# Patient Record
Sex: Female | Born: 1965 | Race: Black or African American | Hispanic: No | Marital: Single | Smoking: Never smoker
Health system: Southern US, Community
[De-identification: ages and names within clinical notes are randomized; demographics above are authoritative.]

## PROBLEM LIST (undated history)

## (undated) DIAGNOSIS — T7840XA Allergy, unspecified, initial encounter: Secondary | ICD-10-CM

## (undated) DIAGNOSIS — R7301 Impaired fasting glucose: Secondary | ICD-10-CM

## (undated) DIAGNOSIS — M1990 Unspecified osteoarthritis, unspecified site: Secondary | ICD-10-CM

## (undated) DIAGNOSIS — N6089 Other benign mammary dysplasias of unspecified breast: Principal | ICD-10-CM

## (undated) HISTORY — DX: Allergy, unspecified, initial encounter: T78.40XA

## (undated) HISTORY — DX: Unspecified osteoarthritis, unspecified site: M19.90

## (undated) HISTORY — DX: Impaired fasting glucose: R73.01

## (undated) HISTORY — DX: Other benign mammary dysplasias of unspecified breast: N60.89

## (undated) HISTORY — PX: KNEE SURGERY: SHX244

---

## 1995-12-09 HISTORY — PX: TUBAL LIGATION: SHX77

## 2000-06-01 ENCOUNTER — Other Ambulatory Visit: Admission: RE | Admit: 2000-06-01 | Discharge: 2000-06-01 | Payer: Self-pay | Admitting: Obstetrics and Gynecology

## 2000-06-11 ENCOUNTER — Encounter: Admission: RE | Admit: 2000-06-11 | Discharge: 2000-06-11 | Payer: Self-pay | Admitting: Obstetrics and Gynecology

## 2000-06-11 ENCOUNTER — Encounter: Payer: Self-pay | Admitting: Obstetrics and Gynecology

## 2000-06-22 ENCOUNTER — Ambulatory Visit (HOSPITAL_COMMUNITY): Admission: RE | Admit: 2000-06-22 | Discharge: 2000-06-22 | Payer: Self-pay | Admitting: Obstetrics and Gynecology

## 2000-06-22 ENCOUNTER — Encounter: Payer: Self-pay | Admitting: Obstetrics and Gynecology

## 2001-07-23 ENCOUNTER — Other Ambulatory Visit: Admission: RE | Admit: 2001-07-23 | Discharge: 2001-07-23 | Payer: Self-pay | Admitting: Obstetrics and Gynecology

## 2001-07-28 ENCOUNTER — Encounter: Payer: Self-pay | Admitting: Obstetrics and Gynecology

## 2001-07-28 ENCOUNTER — Ambulatory Visit (HOSPITAL_COMMUNITY): Admission: RE | Admit: 2001-07-28 | Discharge: 2001-07-28 | Payer: Self-pay | Admitting: Obstetrics and Gynecology

## 2003-01-19 ENCOUNTER — Ambulatory Visit (HOSPITAL_COMMUNITY): Admission: RE | Admit: 2003-01-19 | Discharge: 2003-01-19 | Payer: Self-pay | Admitting: Family Medicine

## 2003-01-19 ENCOUNTER — Encounter: Payer: Self-pay | Admitting: Family Medicine

## 2003-04-17 ENCOUNTER — Other Ambulatory Visit: Admission: RE | Admit: 2003-04-17 | Discharge: 2003-04-17 | Payer: Self-pay | Admitting: Obstetrics and Gynecology

## 2004-04-05 ENCOUNTER — Encounter: Admission: RE | Admit: 2004-04-05 | Discharge: 2004-04-05 | Payer: Self-pay | Admitting: General Surgery

## 2004-06-07 ENCOUNTER — Other Ambulatory Visit: Admission: RE | Admit: 2004-06-07 | Discharge: 2004-06-07 | Payer: Self-pay | Admitting: Obstetrics and Gynecology

## 2005-06-25 ENCOUNTER — Encounter: Admission: RE | Admit: 2005-06-25 | Discharge: 2005-06-25 | Payer: Self-pay | Admitting: Family Medicine

## 2005-07-01 ENCOUNTER — Other Ambulatory Visit: Admission: RE | Admit: 2005-07-01 | Discharge: 2005-07-01 | Payer: Self-pay | Admitting: Obstetrics and Gynecology

## 2006-06-18 ENCOUNTER — Other Ambulatory Visit: Admission: RE | Admit: 2006-06-18 | Discharge: 2006-06-18 | Payer: Self-pay | Admitting: Obstetrics and Gynecology

## 2006-06-29 ENCOUNTER — Encounter: Admission: RE | Admit: 2006-06-29 | Discharge: 2006-06-29 | Payer: Self-pay | Admitting: Obstetrics and Gynecology

## 2007-07-05 ENCOUNTER — Encounter: Admission: RE | Admit: 2007-07-05 | Discharge: 2007-07-05 | Payer: Self-pay | Admitting: Obstetrics and Gynecology

## 2008-07-05 ENCOUNTER — Encounter: Admission: RE | Admit: 2008-07-05 | Discharge: 2008-07-05 | Payer: Self-pay | Admitting: Family Medicine

## 2008-07-27 ENCOUNTER — Encounter: Admission: RE | Admit: 2008-07-27 | Discharge: 2008-07-27 | Payer: Self-pay | Admitting: Family Medicine

## 2008-08-11 ENCOUNTER — Encounter (INDEPENDENT_AMBULATORY_CARE_PROVIDER_SITE_OTHER): Payer: Self-pay | Admitting: Obstetrics and Gynecology

## 2008-08-11 ENCOUNTER — Ambulatory Visit (HOSPITAL_COMMUNITY): Admission: RE | Admit: 2008-08-11 | Discharge: 2008-08-11 | Payer: Self-pay | Admitting: Obstetrics and Gynecology

## 2009-05-15 ENCOUNTER — Encounter
Admission: RE | Admit: 2009-05-15 | Discharge: 2009-05-15 | Payer: Self-pay | Admitting: Physical Medicine and Rehabilitation

## 2009-08-31 ENCOUNTER — Encounter: Admission: RE | Admit: 2009-08-31 | Discharge: 2009-08-31 | Payer: Self-pay | Admitting: Obstetrics and Gynecology

## 2010-09-27 ENCOUNTER — Encounter: Admission: RE | Admit: 2010-09-27 | Discharge: 2010-09-27 | Payer: Self-pay | Admitting: Obstetrics and Gynecology

## 2010-12-08 DIAGNOSIS — N6089 Other benign mammary dysplasias of unspecified breast: Secondary | ICD-10-CM

## 2010-12-08 HISTORY — DX: Other benign mammary dysplasias of unspecified breast: N60.89

## 2010-12-29 ENCOUNTER — Encounter: Payer: Self-pay | Admitting: General Surgery

## 2010-12-30 ENCOUNTER — Encounter: Payer: Self-pay | Admitting: Family Medicine

## 2011-04-22 NOTE — Op Note (Signed)
NAMEMARIANY, MACKINTOSH                 ACCOUNT NO.:  [id number]   MEDICAL RECORD NO.:  [id number]          PATIENT TYPE:  AMB   LOCATION:  SDC                           FACILITY:  [facility]   PHYSICIAN:  Osborn Coho, M.D.   DATE OF BIRTH:  05-27-1966   DATE OF PROCEDURE:  08/11/2008  DATE OF DISCHARGE:                               OPERATIVE REPORT   PREOPERATIVE DIAGNOSES:  1. Menorrhagia.  2. Endometrial mass.   POSTOPERATIVE DIAGNOSIS:  Menorrhagia.   PROCEDURES:  1. Hysteroscopy.  2. Dilation and curettage.  3. Ablation via NovaSure.   ATTENDING:  Osborn Coho, MD   ANESTHESIA:  General via LMA.   SPECIMENS TO PATHOLOGY:  Endometrial curettings.   FINDINGS:  No obvious intracavitary lesion noted.   FLUIDS:  1300 mL.   URINE OUTPUT:  Quantity sufficient via straight cath prior to procedure.   ESTIMATED BLOOD LOSS:  Minimal.   HYSTEROSCOPIC FLUID:  Deficit of LR, 90 mL.   COMPLICATIONS:  None.   DESCRIPTION OF PROCEDURE:  The patient was taken to the operating room  after risks, benefits, and alternatives discussed with the patient.  The  patient verbalized understanding and consent signed and witnessed.  The  patient was placed under general per anesthesia and prepped and draped  in the normal sterile fashion.  A bivalve speculum was placed in the  patient's vagina, and the anterior lip of the cervix grasped with a  single-tooth tenaculum.  A paracervical block was administered using a  total of 10 mL of 1% lidocaine.  The cervix was dilated for passage of  the hysteroscope.  The uterus sounded to 10 cm, and the cervical length  measured 5 cm.  The diagnostic hysteroscope was introduced and no  obvious intracavitary lesions were noted.  Sharp curettage was performed  until a gritty texture was noted and curettings sent to pathology.  The  cervix was then dilated for passage of the NovaSure instrument.  The  NovaSure instrument was introduced into the intrauterine  cavity with a  cavity length set at 5 cm and cavity width measuring 4.5 cm.  The  ablation was performed for 86 seconds at a power of 124 watts.  The  procedure was performed without difficulty, and after removing the  NovaSure instrument, diagnostic hysteroscope  was introduced once again and good ablation results were noted.  All  instruments were removed, and there was good hemostasis at the tenaculum  site.  Count was correct.  The patient tolerated the procedure well and  is currently awaiting transfer to the recovery room in good condition.      Osborn Coho, M.D.  Electronically Signed     AR/MEDQ  D:  08/11/2008  T:  08/12/2008  Job:  [id number]

## 2011-08-18 ENCOUNTER — Other Ambulatory Visit: Payer: Self-pay | Admitting: Obstetrics and Gynecology

## 2011-08-18 DIAGNOSIS — Z1231 Encounter for screening mammogram for malignant neoplasm of breast: Secondary | ICD-10-CM

## 2011-09-10 LAB — CBC
Hemoglobin: 12
MCV: 89.4
Platelets: 221
RBC: 4.02
RDW: 14
WBC: 3.5 — ABNORMAL LOW

## 2011-10-02 ENCOUNTER — Other Ambulatory Visit: Payer: Self-pay | Admitting: Obstetrics and Gynecology

## 2011-10-02 ENCOUNTER — Ambulatory Visit
Admission: RE | Admit: 2011-10-02 | Discharge: 2011-10-02 | Disposition: A | Discharge status: Home / Self Care | Payer: 59 | Source: Ambulatory Visit | Attending: Obstetrics and Gynecology | Admitting: Obstetrics and Gynecology

## 2011-10-02 DIAGNOSIS — Z1231 Encounter for screening mammogram for malignant neoplasm of breast: Secondary | ICD-10-CM

## 2011-10-06 ENCOUNTER — Ambulatory Visit (INDEPENDENT_AMBULATORY_CARE_PROVIDER_SITE_OTHER): Payer: Self-pay | Admitting: Surgery

## 2011-10-15 ENCOUNTER — Other Ambulatory Visit: Payer: 59

## 2011-10-20 ENCOUNTER — Ambulatory Visit (INDEPENDENT_AMBULATORY_CARE_PROVIDER_SITE_OTHER): Payer: 59 | Admitting: Surgery

## 2011-10-31 ENCOUNTER — Other Ambulatory Visit: Payer: Self-pay | Admitting: Obstetrics and Gynecology

## 2011-10-31 ENCOUNTER — Ambulatory Visit
Admission: RE | Admit: 2011-10-31 | Discharge: 2011-10-31 | Disposition: A | Discharge status: Home / Self Care | Payer: 59 | Source: Ambulatory Visit | Attending: Obstetrics and Gynecology | Admitting: Obstetrics and Gynecology

## 2011-10-31 DIAGNOSIS — N6325 Unspecified lump in the left breast, overlapping quadrants: Secondary | ICD-10-CM

## 2011-11-05 ENCOUNTER — Ambulatory Visit (INDEPENDENT_AMBULATORY_CARE_PROVIDER_SITE_OTHER): Payer: 59 | Admitting: General Surgery

## 2011-11-05 ENCOUNTER — Encounter (INDEPENDENT_AMBULATORY_CARE_PROVIDER_SITE_OTHER): Payer: Self-pay | Admitting: General Surgery

## 2011-11-05 VITALS — BP 132/84 | HR 70 | Temp 97.6°F | Resp 16 | Ht 65.5 in | Wt 203.8 lb

## 2011-11-05 DIAGNOSIS — N6089 Other benign mammary dysplasias of unspecified breast: Secondary | ICD-10-CM

## 2011-11-05 NOTE — Progress Notes (Signed)
Patient ID: Sharon Bray, female   DOB: 26-Sep-1966, 46 y.o.   MRN: [id number]  Chief Complaint  Patient presents with  . Other    Evaluate left breast cyst    HPI Sharon Bray is a 45 y.o. female. This patient is referred by Dr. Normand Sloop for evaluation of a left breast lesion. She states that this has been present for a few months and has been evaluated for this already by our practice, but elected not to have anything done at that time for fear that it might return. She states that this fluctuates in size and will occasionally get a "rash" in the area. She denies any drainage from the site. She denies any fevers or chills. She denies any other breast masses. She does state that she does her breast exam and has not noticed any other suspicious lesion. She denies any family history of breast cancer. HPI  Past Medical History  Diagnosis Date  . Arthritis   . Sebaceous cyst of breast 2012    left breast    Past Surgical History  Procedure Date  . Knee surgery 1997, 2004  . Tubal ligation 1997    No family history on file.  Social History History  Substance Use Topics  . Smoking status: Never Smoker   . Smokeless tobacco: Not on file  . Alcohol Use: No    Allergies no known allergies  No current outpatient prescriptions on file.    Review of Systems Review of Systems All other review of systems negative or noncontributory except as stated in the HPI  Blood pressure 132/84, pulse 70, temperature 97.6 F (36.4 C), temperature source Temporal, resp. rate 16, height 5' 5.5" (1.664 m), weight 203 lb 12.8 oz (92.443 kg).  Physical Exam Physical Exam  Vitals reviewed. Constitutional: She is oriented to person, place, and time. She appears well-developed and well-nourished. No distress.  HENT:  Head: Normocephalic and atraumatic.  Mouth/Throat: No oropharyngeal exudate.  Eyes: Conjunctivae are normal. Pupils are equal, round, and reactive to light. No scleral icterus.  Neck:  Normal range of motion. No tracheal deviation present.  Cardiovascular: Normal rate, regular rhythm and normal heart sounds.  Exam reveals no gallop.   Pulmonary/Chest: Effort normal and breath sounds normal. No stridor. No respiratory distress. She has no wheezes.       She has no suspicious gross masses bilaterally or axillary lymphadenopathy. On the inner left breast at the inframammary crease she has a 2 cm x 2 cm area of induration with central punctum consistent with epidermal inclusion cyst. There is no evidence of erythema or infection.  Abdominal: Soft. Bowel sounds are normal. She exhibits no distension. There is no tenderness.  Musculoskeletal: Normal range of motion. She exhibits no edema.  Neurological: She is alert and oriented to person, place, and time. She has normal reflexes.  Skin: Skin is warm and dry. No rash noted. She is not diaphoretic. No erythema. No pallor.  Psychiatric: She has a normal mood and affect. Her behavior is normal. Judgment and thought content normal.    Data Reviewed Mammo  Assessment    Left breast sebaceous cyst  This patient to be a sebaceous cyst without any active inflammation or infection. I've discussed with her the options for continued observation or surgical excision and she would like to proceed with excision. We discussed the risks of procedure including poor cosmesis and recurrence and she expressed understanding and desires to proceed with excision of left breast cyst.  Plan    We'll plan for surgical excision as soon as available       Lodema Pilot DAVID 11/05/2011, 8:51 AM

## 2012-08-20 ENCOUNTER — Other Ambulatory Visit: Payer: Self-pay | Admitting: Obstetrics and Gynecology

## 2012-08-20 DIAGNOSIS — Z1231 Encounter for screening mammogram for malignant neoplasm of breast: Secondary | ICD-10-CM

## 2012-10-11 ENCOUNTER — Ambulatory Visit
Admission: RE | Admit: 2012-10-11 | Discharge: 2012-10-11 | Disposition: A | Discharge status: Home / Self Care | Payer: 59 | Source: Ambulatory Visit | Attending: Obstetrics and Gynecology | Admitting: Obstetrics and Gynecology

## 2012-10-11 ENCOUNTER — Encounter: Payer: Self-pay | Admitting: Obstetrics and Gynecology

## 2012-10-11 ENCOUNTER — Ambulatory Visit (INDEPENDENT_AMBULATORY_CARE_PROVIDER_SITE_OTHER): Payer: 59 | Admitting: Obstetrics and Gynecology

## 2012-10-11 VITALS — BP 122/80 | Ht 65.0 in | Wt 221.0 lb

## 2012-10-11 DIAGNOSIS — Z1231 Encounter for screening mammogram for malignant neoplasm of breast: Secondary | ICD-10-CM

## 2012-10-11 DIAGNOSIS — R21 Rash and other nonspecific skin eruption: Secondary | ICD-10-CM

## 2012-10-11 DIAGNOSIS — Z124 Encounter for screening for malignant neoplasm of cervix: Secondary | ICD-10-CM

## 2012-10-11 MED ORDER — CLOTRIMAZOLE 1 % EX CREA: TOPICAL_CREAM | Freq: Two times a day (BID) | CUTANEOUS | Status: DC

## 2012-10-11 NOTE — Progress Notes (Signed)
Last Pap: 09/27/11 WNL: Yes Regular Periods:no Contraception: na/  Monthly Breast exam:yes Tetanus<61yrs:yes Nl.Bladder Function:yes Daily BMs:yes Healthy Diet:yes Calcium:no Mammogram:yes Date of Mammogram: 2013 wnl per pt Exercise:yes Have often Exercise: 3 times a week  Seatbelt: yes Abuse at home: no Stressful work:no Sigmoid-colonoscopy: n/a Bone Density: No PCP: Dr. Threasa Beards Change in PMH: no change  Change in FMH:no change BP 122/80  Ht 5\' 5"  (1.651 m)  Wt 221 lb (100.245 kg)  BMI 36.78 kg/m2 Pt with complaints:yes pt with rash under her breast.   Physical Examination: General appearance - alert, well appearing, and in no distress Mental status - normal mood, behavior, speech, dress, motor activity, and thought processes Neck - supple, no significant adenopathy,  thyroid exam: thyroid is normal in size without nodules or tenderness Chest - clear to auscultation, no wheezes, rales or rhonchi, symmetric air entry Heart - normal rate and regular rhythm Abdomen - soft, nontender, nondistended, no masses or organomegaly Breasts - breasts appear normal, no suspicious masses, no skin or nipple changes or axillary nodes Pelvic - normal external genitalia, vulva, vagina, cervix, uterus and adnexa Rectal - rectal exam not indicated Back exam - full range of motion, no tenderness, palpable spasm or pain on motion Neurological - alert, oriented, normal speech, no focal findings or movement disorder noted Musculoskeletal - no joint tenderness, deformity or swelling Extremities - no edema, redness or tenderness in the calves or thighs Skin - normal coloration and turgor, no rashes, no suspicious skin lesions noted Routine exam Pap sent no due 2015 Mammogram due done today.  Pt with known breast cyst BTL used for contraception RT 1 yr Pt happy with ablation .  Often does not have a menses

## 2012-10-11 NOTE — Patient Instructions (Signed)

## 2012-10-12 ENCOUNTER — Encounter: Payer: Self-pay | Admitting: Obstetrics and Gynecology

## 2012-11-25 ENCOUNTER — Encounter (INDEPENDENT_AMBULATORY_CARE_PROVIDER_SITE_OTHER): Payer: Self-pay | Admitting: General Surgery

## 2012-11-25 ENCOUNTER — Ambulatory Visit (INDEPENDENT_AMBULATORY_CARE_PROVIDER_SITE_OTHER): Payer: 59 | Admitting: General Surgery

## 2012-11-25 VITALS — BP 134/76 | HR 77 | Temp 98.8°F | Resp 18 | Ht 65.5 in | Wt 222.8 lb

## 2012-11-25 DIAGNOSIS — L720 Epidermal cyst: Secondary | ICD-10-CM

## 2012-11-25 DIAGNOSIS — L723 Sebaceous cyst: Secondary | ICD-10-CM

## 2012-11-25 NOTE — Progress Notes (Signed)
Patient ID: Sharon Bray, female   DOB: 19-Jan-1966, 46 y.o.   MRN: [id number]  Chief Complaint  Patient presents with  . Establish Care    cyst under left breast    HPI Sharon Bray is a 46 y.o. female. This patient is known to me for prior evaluation of the left breast skin cyst. She was seen by me last year and offered excision of this.  She was actually seen previously in our office as well as for treatment of this skin cyst was infected. She completed antibiotics at that time and since then has only had some intermittent episodes of inflammation where the lesion swells and then spontaneously decreases in size. She recently had a mammogram which was okay by patient report. HPI  Past Medical History  Diagnosis Date  . Arthritis   . Sebaceous cyst of breast 2012    left breast    Past Surgical History  Procedure Date  . Knee surgery 1997, 2004  . Tubal ligation 1997    No family history on file.  Social History History  Substance Use Topics  . Smoking status: Never Smoker   . Smokeless tobacco: Not on file  . Alcohol Use: No    No Known Allergies  Current Outpatient Prescriptions  Medication Sig Dispense Refill  . clotrimazole (LOTRIMIN AF) 1 % cream Apply topically 2 (two) times daily.  30 g  0  . doxycycline (PERIOSTAT) 20 MG tablet Take 20 mg by mouth 2 (two) times daily.      Marland Kitchen acyclovir (ZOVIRAX) 400 MG tablet         Review of Systems Review of Systems All other review of systems negative or noncontributory except as stated in the HPI  Blood pressure 134/76, pulse 77, temperature 98.8 F (37.1 C), resp. rate 18, height 5' 5.5" (1.664 m), weight 222 lb 12.8 oz (101.061 kg).  Physical Exam Physical Exam Physical Exam  Nursing note and vitals reviewed. Constitutional: She is oriented to person, place, and time. She appears well-developed and well-nourished. No distress.  HENT:  Head: Normocephalic and atraumatic.  Mouth/Throat: No oropharyngeal exudate.   Eyes: Conjunctivae and EOM are normal. Pupils are equal, round, and reactive to light. Right eye exhibits no discharge. Left eye exhibits no discharge. No scleral icterus.  Neck: Normal range of motion. Neck supple. No tracheal deviation present.  Cardiovascular: Normal rate, regular rhythm, normal heart sounds and intact distal pulses.   Pulmonary/Chest: Effort normal and breath sounds normal. No stridor. No respiratory distress. She has no wheezes.  Abdominal: Soft. Bowel sounds are normal. She exhibits no distension and no mass. There is no tenderness. There is no rebound and no guarding.  Musculoskeletal: Normal range of motion. She exhibits no edema and no tenderness.  Neurological: She is alert and oriented to person, place, and time.  Skin: Skin is warm and dry. No rash noted. She is not diaphoretic. No erythema. No pallor.  Psychiatric: She has a normal mood and affect. Her behavior is normal. Judgment and thought content normal.  Breast: left breast has a 2 cm EDIC at the inner and inferior portion of the breast, no sign of infection  Data Reviewed   Assessment    Left breast epidermal inclusion cyst-no sign of infection I believe that this is a benign epidermal inclusion cyst but it sounds like it has bothered her with infection and subacute infections with swelling and spontaneous resolution. She would like have this removed. I offered excision.  We discussed the risks of infection, bleeding, pain, scarring, recurrence, and persistent pain. She says that she does have some pain when lying on her left side and I explained that this was unlikely to to the skin cyst and that would likely persist even after excision.    Plan    Will plan for excision of left breast skin cyst       Mazal Ebey DAVID 11/25/2012, 11:06 AM

## 2013-03-28 ENCOUNTER — Ambulatory Visit (INDEPENDENT_AMBULATORY_CARE_PROVIDER_SITE_OTHER): Payer: 59 | Admitting: Nurse Practitioner

## 2013-03-28 ENCOUNTER — Encounter: Payer: Self-pay | Admitting: Nurse Practitioner

## 2013-03-28 VITALS — BP 122/82 | Temp 98.5°F | Wt 232.2 lb

## 2013-03-28 DIAGNOSIS — R7309 Other abnormal glucose: Secondary | ICD-10-CM

## 2013-03-28 DIAGNOSIS — R609 Edema, unspecified: Secondary | ICD-10-CM

## 2013-03-28 DIAGNOSIS — R7302 Impaired glucose tolerance (oral): Secondary | ICD-10-CM | POA: Insufficient documentation

## 2013-03-28 DIAGNOSIS — R5383 Other fatigue: Secondary | ICD-10-CM

## 2013-03-28 DIAGNOSIS — E781 Pure hyperglyceridemia: Secondary | ICD-10-CM | POA: Insufficient documentation

## 2013-03-28 MED ORDER — PHENTERMINE HCL 37.5 MG PO CAPS: 37.5000 mg | ORAL_CAPSULE | ORAL | Status: DC

## 2013-03-28 NOTE — Assessment & Plan Note (Signed)
Routine lab work. Discussed importance of weight loss with healthy diet and activity. Phentermine 37.5 mg 1 by mouth every morning. Reviewed potential adverse effects. DC med and call if any problems. Otherwise recheck in one month.

## 2013-03-28 NOTE — Progress Notes (Signed)
Subjective:  Presents for complaints of mild swelling in the ankles especially in the evenings for the past 3-4 months. Occurs about every day. Now has a job sitting at a desk at work. Occurs whether she is walking or sitting. No chest pain/ischemic type pain unusual shortness of breath or orthopnea. We have not seen patient in our office since May 2010. According to patient, she has gained over 30 pounds in the past 6 months. Has decreased her exercise. Eats fairly well at work but not doing well with her diet at home. Drinks a lot of water. No sodas or sweet tea. Recently had a health fair at work, a fingerstick cholesterol indicated elevated triglycerides and a fasting sugar of 110. Gets regular preventive health physicals and mammograms. No labs at her gynecologist. Does not add any salt to her diet.  Objective:   BP 122/82  Temp(Src) 98.5 F (36.9 C) (Oral)  Wt 232 lb 4 oz (105.348 kg)  BMI 38.05 kg/m2 NAD. Alert, oriented. Lungs clear. Heart regular rate rhythm. No murmur or gallop noted. Carotids no bruits or thrills. Abdomen soft nondistended without obvious masses or tenderness, exam limited due to abdominal girth. Waist circumference greater than 35 with central obesity. Lower extremities trace pitting edema.  Assessment:Impaired glucose tolerance - Plan: Basic metabolic panel, Hemoglobin A1c, Lipid panel, Basic metabolic panel, Hemoglobin A1c, Lipid panel  Fatigue - Plan: Basic metabolic panel, Hepatic function panel, TSH, Vitamin D 25 hydroxy, Basic metabolic panel, Hepatic function panel, TSH, Vitamin D 25 hydroxy  Morbid obesity - Plan: Basic metabolic panel, Hepatic function panel, Lipid panel, Basic metabolic panel, Hepatic function panel, Lipid panel  Hypertriglyceridemia - Plan: Lipid panel, Lipid panel  Peripheral edema  Plan: Routine lab work. Discussed importance of weight loss with healthy diet and activity. Phentermine 37.5 mg 1 by mouth every morning. Reviewed potential  adverse effects. DC med and call if any problems. Otherwise recheck in one month.

## 2013-03-29 LAB — HEMOGLOBIN A1C: Mean Plasma Glucose: 120 mg/dL — ABNORMAL HIGH (ref <117)

## 2013-03-29 LAB — HEPATIC FUNCTION PANEL
Albumin: 3.6 g/dL (ref 3.5–5.2)
Bilirubin, Direct: 0.1 mg/dL (ref 0.0–0.3)
Total Bilirubin: 0.4 mg/dL (ref 0.3–1.2)

## 2013-03-29 LAB — LIPID PANEL
Cholesterol: 115 mg/dL (ref 0–200)
HDL: 35 mg/dL — ABNORMAL LOW (ref >39)
Total CHOL/HDL Ratio: 3.3 Ratio
Triglycerides: 105 mg/dL (ref <150)

## 2013-03-29 LAB — BASIC METABOLIC PANEL
Calcium: 8.9 mg/dL (ref 8.4–10.5)
Creat: 0.74 mg/dL (ref 0.50–1.10)
Sodium: 141 mEq/L (ref 135–145)

## 2013-03-29 LAB — TSH: TSH: 1.516 u[IU]/mL (ref 0.350–4.500)

## 2013-03-30 LAB — VITAMIN D 25 HYDROXY (VIT D DEFICIENCY, FRACTURES): Vit D, 25-Hydroxy: <10 ng/mL — ABNORMAL LOW (ref 30–89)

## 2013-04-01 ENCOUNTER — Other Ambulatory Visit: Payer: Self-pay

## 2013-04-01 MED ORDER — METFORMIN HCL 500 MG PO TABS: 500.0000 mg | ORAL_TABLET | Freq: Two times a day (BID) | ORAL | Status: DC

## 2013-04-01 MED ORDER — ERGOCALCIFEROL 1.25 MG (50000 UT) PO CAPS: 50000.0000 [IU] | ORAL_CAPSULE | ORAL | Status: DC

## 2013-04-28 ENCOUNTER — Ambulatory Visit (INDEPENDENT_AMBULATORY_CARE_PROVIDER_SITE_OTHER): Payer: 59 | Admitting: Nurse Practitioner

## 2013-04-28 ENCOUNTER — Encounter: Payer: Self-pay | Admitting: Nurse Practitioner

## 2013-04-28 VITALS — BP 113/76 | HR 90 | Wt 225.6 lb

## 2013-04-28 DIAGNOSIS — R7301 Impaired fasting glucose: Secondary | ICD-10-CM

## 2013-04-28 DIAGNOSIS — E559 Vitamin D deficiency, unspecified: Secondary | ICD-10-CM

## 2013-04-28 MED ORDER — PHENTERMINE HCL 37.5 MG PO CAPS: 37.5000 mg | ORAL_CAPSULE | ORAL | Status: DC

## 2013-04-28 NOTE — Patient Instructions (Addendum)
Vitamin D 1000 units per day (in multivitamin)   Alive vitamin

## 2013-04-29 ENCOUNTER — Encounter: Payer: Self-pay | Admitting: Nurse Practitioner

## 2013-04-29 NOTE — Assessment & Plan Note (Signed)
Continue phentermine as directed. Start walking program as planned. Recheck in 3 months.

## 2013-04-29 NOTE — Progress Notes (Signed)
Subjective:  Presents for recheck. Taking her prescription vitamin D. Stopped eating late at night. Does not drink any regular soda or sweet tea. Tries to avoid simple carbs and sugar. Drinking water. Has a desk job. Plans to begin a walking program. Slight swelling in the ankles at times especially when she has been sitting all day. No chest pain shortness of breath orthopnea.  Objective:   BP 113/76  Pulse 90  Wt 225 lb 9.6 oz (102.331 kg)  BMI 36.96 kg/m2 NAD. Alert, oriented. Lungs clear. Heart regular rate rhythm. Lower extremities trace pitting edema.  Assessment:Impaired fasting glucose  Unspecified vitamin D deficiency  Morbid obesity  Plan: Continue phentermine as directed. Complete vitamin D as directed then OTC supplement. Recheck in 3 months, call back sooner if any problems.

## 2013-05-03 ENCOUNTER — Other Ambulatory Visit (HOSPITAL_COMMUNITY)
Admission: RE | Admit: 2013-05-03 | Discharge: 2013-05-03 | Disposition: A | Discharge status: Home / Self Care | Payer: 59 | Source: Ambulatory Visit | Attending: General Surgery | Admitting: General Surgery

## 2013-05-03 ENCOUNTER — Other Ambulatory Visit: Payer: Self-pay | Admitting: General Surgery

## 2013-05-03 ENCOUNTER — Other Ambulatory Visit (HOSPITAL_COMMUNITY): Admission: RE | Admit: 2013-05-03 | Payer: 59 | Source: Ambulatory Visit | Admitting: General Surgery

## 2013-05-03 DIAGNOSIS — N6009 Solitary cyst of unspecified breast: Secondary | ICD-10-CM | POA: Insufficient documentation

## 2013-05-03 DIAGNOSIS — N6019 Diffuse cystic mastopathy of unspecified breast: Secondary | ICD-10-CM | POA: Insufficient documentation

## 2013-07-16 ENCOUNTER — Other Ambulatory Visit: Payer: Self-pay | Admitting: Nurse Practitioner

## 2013-07-29 ENCOUNTER — Ambulatory Visit: Payer: 59 | Admitting: Family Medicine

## 2013-07-30 ENCOUNTER — Encounter: Payer: Self-pay | Admitting: *Deleted

## 2013-08-01 ENCOUNTER — Encounter: Payer: Self-pay | Admitting: Nurse Practitioner

## 2013-08-01 ENCOUNTER — Ambulatory Visit (INDEPENDENT_AMBULATORY_CARE_PROVIDER_SITE_OTHER): Payer: 59 | Admitting: Nurse Practitioner

## 2013-08-01 VITALS — BP 132/88 | Ht 65.5 in | Wt 222.0 lb

## 2013-08-01 DIAGNOSIS — R7301 Impaired fasting glucose: Secondary | ICD-10-CM

## 2013-08-01 MED ORDER — PHENTERMINE HCL 37.5 MG PO CAPS: 37.5000 mg | ORAL_CAPSULE | ORAL | Status: DC

## 2013-08-05 DIAGNOSIS — R7301 Impaired fasting glucose: Secondary | ICD-10-CM | POA: Insufficient documentation

## 2013-08-05 NOTE — Assessment & Plan Note (Signed)
Phentermine for 3 more months then discontinue.

## 2013-08-05 NOTE — Progress Notes (Signed)
Subjective:  Presents for routine followup. Doing better with her diet. Has had some increased stress due to her work schedule. 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 No chest pain shortness of breath or edema. Minimal exercise.  Objective:   BP 132/88  Ht 5' 5.5" (1.664 m)  Wt 222 lb (100.699 kg)  BMI 36.37 kg/m2 NAD. Alert, oriented. Lungs clear. Heart regular rate rhythm. No murmur or gallop noted. FBS 91.  Assessment:Impaired fasting glucose - Plan: POCT glucose (manual entry)  Morbid obesity  Plan: Meds ordered this encounter  Medications  . OVER THE COUNTER MEDICATION    Sig: Vitamin D one daily  . DISCONTD: phentermine 37.5 MG capsule    Sig: Take 1 capsule (37.5 mg total) by mouth every morning.    Dispense:  30 capsule    Refill:  2    Order Specific Question:  Supervising Provider    Answer:  Merlyn Albert [[id number]]  . phentermine 37.5 MG capsule    Sig: Take 1 capsule (37.5 mg total) by mouth every morning.    Dispense:  30 capsule    Refill:  2    Order Specific Question:  Supervising Provider    Answer:  Merlyn Albert [[id number]]   Patient given 3 months of phentermine then discontinue. Encouraged healthy diet and regular activity. Recommend physical in October.

## 2013-08-30 ENCOUNTER — Ambulatory Visit: Payer: 59 | Admitting: Family Medicine

## 2013-09-26 ENCOUNTER — Other Ambulatory Visit: Payer: Self-pay

## 2013-09-26 DIAGNOSIS — Z1231 Encounter for screening mammogram for malignant neoplasm of breast: Secondary | ICD-10-CM

## 2013-10-13 ENCOUNTER — Other Ambulatory Visit: Payer: Self-pay

## 2013-10-20 ENCOUNTER — Telehealth: Payer: Self-pay | Admitting: *Deleted

## 2013-10-20 NOTE — Telephone Encounter (Signed)
Pt states area at 06/30/2013 toenail removal procedure is darkened.  I left a message informing pt, the area could be darkened up to 6 - 9 months, but should gradually dissipate, as long there is no redness, drainage or pain, the post-op was progressing as expected.

## 2013-10-21 ENCOUNTER — Ambulatory Visit
Admission: RE | Admit: 2013-10-21 | Discharge: 2013-10-21 | Disposition: A | Discharge status: Home / Self Care | Payer: 59 | Source: Ambulatory Visit

## 2013-10-21 DIAGNOSIS — Z1231 Encounter for screening mammogram for malignant neoplasm of breast: Secondary | ICD-10-CM

## 2014-01-02 ENCOUNTER — Ambulatory Visit: Payer: 59 | Admitting: Nurse Practitioner

## 2014-01-16 ENCOUNTER — Encounter: Payer: Self-pay | Admitting: Nurse Practitioner

## 2014-01-16 ENCOUNTER — Ambulatory Visit (INDEPENDENT_AMBULATORY_CARE_PROVIDER_SITE_OTHER): Payer: 59 | Admitting: Nurse Practitioner

## 2014-01-16 VITALS — BP 146/88 | Ht 65.5 in | Wt 227.0 lb

## 2014-01-16 DIAGNOSIS — R7309 Other abnormal glucose: Secondary | ICD-10-CM

## 2014-01-16 DIAGNOSIS — R7301 Impaired fasting glucose: Secondary | ICD-10-CM

## 2014-01-16 DIAGNOSIS — R739 Hyperglycemia, unspecified: Secondary | ICD-10-CM

## 2014-01-16 DIAGNOSIS — R7302 Impaired glucose tolerance (oral): Secondary | ICD-10-CM

## 2014-01-16 LAB — GLUCOSE, POCT (MANUAL RESULT ENTRY): POC Glucose: 102 mg/dl — AB (ref 70–99)

## 2014-01-16 LAB — POCT GLYCOSYLATED HEMOGLOBIN (HGB A1C): Hemoglobin A1C: 5.8

## 2014-01-16 MED ORDER — METFORMIN HCL 500 MG PO TABS: 500.0000 mg | ORAL_TABLET | Freq: Two times a day (BID) | ORAL | Status: DC

## 2014-01-20 ENCOUNTER — Encounter: Payer: Self-pay | Admitting: Nurse Practitioner

## 2014-01-20 NOTE — Progress Notes (Signed)
Subjective:  Presents for routine followup of her sugar. Has not done very well with her diet. Limited exercise. No chest pain shortness of breath or edema. Gets regular preventive health physicals with her gynecologist, has had her Pap smear and mammogram. Is due for her colonoscopy later this year. Would like information on this.  Objective:   BP 146/88  Ht 5' 5.5" (1.664 m)  Wt 227 lb (102.967 kg)  BMI 37.19 kg/m2 NAD. Alert, oriented. Lungs clear. Heart regular rate rhythm. Lower extremities no edema. Results for orders placed in visit on 01/16/14  POCT GLYCOSYLATED HEMOGLOBIN (HGB A1C)      Result Value Ref Range   Hemoglobin A1C 5.8    GLUCOSE, POCT (MANUAL RESULT ENTRY)      Result Value Ref Range   POC Glucose 102 (*) 70 - 99 mg/dl   Assessment: Problem List Items Addressed This Visit     Endocrine   Impaired glucose tolerance   Impaired fasting glucose - Primary     Other   Morbid obesity   Relevant Medications      metFORMIN (GLUCOPHAGE) tablet    Other Visit Diagnoses   Hyperglycemia        Relevant Orders       POCT glycosylated hemoglobin (Hb A1C) (Completed)       POCT glucose (manual entry) (Completed)      Plan: Meds ordered this encounter  Medications  . metFORMIN (GLUCOPHAGE) 500 MG tablet    Sig: Take 1 tablet (500 mg total) by mouth 2 (two) times daily with a meal.    Dispense:  60 tablet    Refill:  5    Order Specific Question:  Supervising Provider    Answer:  Merlyn AlbertLUKING, WILLIAM S [[id number]]   Discussed importance of regular activity healthy diet and weight loss. Set a goal of 22 pounds or 10% of her body weight has our goal. Given written information on colonoscopy, patient to schedule this. Return in about 4 months (around 05/16/2014). Patient to call before visit so routine yearly lab work can be ordered.

## 2014-03-21 ENCOUNTER — Telehealth: Payer: Self-pay | Admitting: Family Medicine

## 2014-03-21 NOTE — Telephone Encounter (Signed)
[facility] [place]

## 2014-03-21 NOTE — Telephone Encounter (Signed)
Patient was seen back in February and wasn't given a prescription for test strips to check her sugar.so she need that .    She is going to get the [facility] machine will she need a prescription for that Call into [facility] [place] phone number [phone]

## 2014-03-21 NOTE — Telephone Encounter (Signed)
Please call in Rx for glucose testing strips; not sure how many times a day she is testing. Please give a year refill.

## 2014-03-28 ENCOUNTER — Telehealth: Payer: Self-pay | Admitting: Family Medicine

## 2014-03-28 NOTE — Telephone Encounter (Signed)
Patient said that [facility] [place] said they never received the Rx for glucose testing strips and she needs this re faxed in.

## 2014-03-28 NOTE — Telephone Encounter (Signed)
rx refaxed. Pt notified on voicemail.

## 2014-06-06 ENCOUNTER — Encounter: Payer: Self-pay | Admitting: Nurse Practitioner

## 2014-06-06 ENCOUNTER — Ambulatory Visit (INDEPENDENT_AMBULATORY_CARE_PROVIDER_SITE_OTHER): Payer: 59 | Admitting: Nurse Practitioner

## 2014-06-06 VITALS — BP 124/88 | Ht 65.5 in | Wt 229.0 lb

## 2014-06-06 DIAGNOSIS — R5383 Other fatigue: Secondary | ICD-10-CM

## 2014-06-06 DIAGNOSIS — E781 Pure hyperglyceridemia: Secondary | ICD-10-CM

## 2014-06-06 DIAGNOSIS — R7302 Impaired glucose tolerance (oral): Secondary | ICD-10-CM

## 2014-06-06 DIAGNOSIS — R5381 Other malaise: Secondary | ICD-10-CM

## 2014-06-06 DIAGNOSIS — R7309 Other abnormal glucose: Secondary | ICD-10-CM

## 2014-06-06 DIAGNOSIS — R7301 Impaired fasting glucose: Secondary | ICD-10-CM

## 2014-06-06 MED ORDER — ACYCLOVIR 400 MG PO TABS: ORAL_TABLET | ORAL | Status: DC

## 2014-06-06 MED ORDER — METFORMIN HCL 500 MG PO TABS: 500.0000 mg | ORAL_TABLET | Freq: Two times a day (BID) | ORAL | Status: DC

## 2014-06-07 ENCOUNTER — Other Ambulatory Visit: Payer: Self-pay | Admitting: Nurse Practitioner

## 2014-06-07 LAB — BASIC METABOLIC PANEL
BUN: 11 mg/dL (ref 6–23)
CO2: 25 meq/L (ref 19–32)
Calcium: 8.7 mg/dL (ref 8.4–10.5)
Chloride: 108 mEq/L (ref 96–112)
Creat: 0.76 mg/dL (ref 0.50–1.10)
GLUCOSE: 86 mg/dL (ref 70–99)
POTASSIUM: 3.8 meq/L (ref 3.5–5.3)
SODIUM: 140 meq/L (ref 135–145)

## 2014-06-07 LAB — HEPATIC FUNCTION PANEL
ALK PHOS: 59 U/L (ref 39–117)
ALT: 11 U/L (ref 0–35)
AST: 15 U/L (ref 0–37)
Albumin: 3.9 g/dL (ref 3.5–5.2)
BILIRUBIN INDIRECT: 0.4 mg/dL (ref 0.2–1.2)
BILIRUBIN TOTAL: 0.5 mg/dL (ref 0.2–1.2)
Bilirubin, Direct: 0.1 mg/dL (ref 0.0–0.3)
Total Protein: 6.9 g/dL (ref 6.0–8.3)

## 2014-06-07 LAB — LIPID PANEL
CHOL/HDL RATIO: 3.9 ratio
Cholesterol: 125 mg/dL (ref 0–200)
HDL: 32 mg/dL — AB (ref >39)
LDL CALC: 66 mg/dL (ref 0–99)
Triglycerides: 134 mg/dL (ref <150)
VLDL: 27 mg/dL (ref 0–40)

## 2014-06-07 LAB — TSH: TSH: 1.233 u[IU]/mL (ref 0.350–4.500)

## 2014-06-07 LAB — VITAMIN D 25 HYDROXY (VIT D DEFICIENCY, FRACTURES): VIT D 25 HYDROXY: 14 ng/mL — AB (ref 30–89)

## 2014-06-07 MED ORDER — VITAMIN D (ERGOCALCIFEROL) 1.25 MG (50000 UNIT) PO CAPS: 50000.0000 [IU] | ORAL_CAPSULE | ORAL | Status: DC

## 2014-06-08 ENCOUNTER — Encounter: Payer: Self-pay | Admitting: Nurse Practitioner

## 2014-06-08 NOTE — Progress Notes (Signed)
Subjective:  Presents for recheck. Taking her metformin twice a day. Blood sugars at home have run less than 100 fasting. Gets regular preventive health physicals with her gynecologist. No chest pain shortness of breath or edema. Continues to work on weight loss.  Objective:   BP 124/88  Ht 5' 5.5" (1.664 m)  Wt 229 lb (103.874 kg)  BMI 37.51 kg/m2 NAD. Alert, oriented. Lungs clear. Heart regular rate rhythm. Lower extremities no edema.  Assessment:  Problem List Items Addressed This Visit     Endocrine   Impaired glucose tolerance   Relevant Orders      Basic metabolic panel (Completed)   Impaired fasting glucose - Primary   Relevant Orders      Basic metabolic panel (Completed)     Other   Morbid obesity   Relevant Medications      metFORMIN (GLUCOPHAGE) tablet   Other Relevant Orders      Hepatic function panel (Completed)   Hypertriglyceridemia   Relevant Orders      Hepatic function panel (Completed)      Lipid panel (Completed)    Other Visit Diagnoses   Other malaise and fatigue        Relevant Orders       TSH (Completed)       Vit D  25 hydroxy (rtn osteoporosis monitoring) (Completed)      Plan: Meds ordered this encounter  Medications  . DISCONTD: metFORMIN (GLUCOPHAGE) 500 MG tablet    Sig: Take 1 tablet (500 mg total) by mouth 2 (two) times daily with a meal.    Dispense:  60 tablet    Refill:  5    Order Specific Question:  Supervising Provider    Answer:  Merlyn AlbertLUKING, WILLIAM S [[id number]]  . DISCONTD: acyclovir (ZOVIRAX) 400 MG tablet    Sig: One po TID x 5 d    Dispense:  15 tablet    Refill:  6    Order Specific Question:  Supervising Provider    Answer:  Merlyn AlbertLUKING, WILLIAM S [[id number]]  . metFORMIN (GLUCOPHAGE) 500 MG tablet    Sig: Take 1 tablet (500 mg total) by mouth 2 (two) times daily with a meal.    Dispense:  60 tablet    Refill:  5    Order Specific Question:  Supervising Provider    Answer:  Merlyn AlbertLUKING, WILLIAM S [[id number]]  . acyclovir (ZOVIRAX) 400 MG  tablet    Sig: One po TID x 5 d    Dispense:  15 tablet    Refill:  6    Order Specific Question:  Supervising Provider    Answer:  Merlyn AlbertLUKING, WILLIAM S [[id number]]   Continue current meds as directed. Recommend healthy diet, regular activity and weight loss. Lab work ordered. Return in about 4 months (around 10/06/2014).

## 2014-08-09 ENCOUNTER — Other Ambulatory Visit: Payer: Self-pay

## 2014-08-09 DIAGNOSIS — Z1231 Encounter for screening mammogram for malignant neoplasm of breast: Secondary | ICD-10-CM

## 2014-10-06 ENCOUNTER — Ambulatory Visit (INDEPENDENT_AMBULATORY_CARE_PROVIDER_SITE_OTHER): Payer: 59 | Admitting: Nurse Practitioner

## 2014-10-06 ENCOUNTER — Encounter: Payer: Self-pay | Admitting: Nurse Practitioner

## 2014-10-06 VITALS — BP 128/90 | Ht 65.5 in | Wt 229.0 lb

## 2014-10-06 DIAGNOSIS — R7302 Impaired glucose tolerance (oral): Secondary | ICD-10-CM

## 2014-10-06 DIAGNOSIS — R7301 Impaired fasting glucose: Secondary | ICD-10-CM

## 2014-10-06 LAB — POCT GLYCOSYLATED HEMOGLOBIN (HGB A1C): Hemoglobin A1C: 5.3

## 2014-10-09 ENCOUNTER — Encounter: Payer: Self-pay | Admitting: Nurse Practitioner

## 2014-10-09 NOTE — Progress Notes (Signed)
Subjective:  Presents for routine follow-up. Fasting blood sugar at home running less than 100 on average. Healthy diet. Does tend to eat late at nighttime. No chest pain/shortness of breath. Gets regular preventive health physicals including mammogram and colonoscopy.  Objective:   BP 128/90 mmHg  Ht 5' 5.5" (1.664 m)  Wt 229 lb (103.874 kg)  BMI 37.51 kg/m2 NAD. Alert, oriented. Lungs clear. Heart regular rate rhythm. Lower extremities no edema. Results for orders placed or performed in visit on 10/06/14  POCT glycosylated hemoglobin (Hb A1C)  Result Value Ref Range   Hemoglobin A1C 5.3      Assessment:  Problem List Items Addressed This Visit      Endocrine   Impaired glucose tolerance   Relevant Orders      POCT glycosylated hemoglobin (Hb A1C) (Completed)   Impaired fasting glucose - Primary   Relevant Orders      POCT glycosylated hemoglobin (Hb A1C) (Completed)     Other   Morbid obesity     Plan: recommend healthy diet and regular exercise. Discussed importance of weight loss particularly around the abdominal area. Continue current medications as directed. Also discussed bariatric surgery. Return in about 6 months (around 04/07/2015).

## 2014-10-25 ENCOUNTER — Ambulatory Visit: Payer: 59

## 2014-10-25 ENCOUNTER — Ambulatory Visit
Admission: RE | Admit: 2014-10-25 | Discharge: 2014-10-25 | Disposition: A | Discharge status: Home / Self Care | Payer: 59 | Source: Ambulatory Visit

## 2014-10-25 DIAGNOSIS — Z1231 Encounter for screening mammogram for malignant neoplasm of breast: Secondary | ICD-10-CM

## 2015-03-12 ENCOUNTER — Other Ambulatory Visit: Payer: Self-pay | Admitting: Nurse Practitioner

## 2015-03-16 ENCOUNTER — Other Ambulatory Visit: Payer: Self-pay | Admitting: Nurse Practitioner

## 2015-04-06 ENCOUNTER — Ambulatory Visit (INDEPENDENT_AMBULATORY_CARE_PROVIDER_SITE_OTHER): Payer: 59 | Admitting: Nurse Practitioner

## 2015-04-06 ENCOUNTER — Encounter: Payer: Self-pay | Admitting: Nurse Practitioner

## 2015-04-06 VITALS — BP 122/94 | Ht 65.5 in | Wt 231.0 lb

## 2015-04-06 DIAGNOSIS — R7302 Impaired glucose tolerance (oral): Secondary | ICD-10-CM | POA: Diagnosis not present

## 2015-04-06 DIAGNOSIS — IMO0001 Reserved for inherently not codable concepts without codable children: Secondary | ICD-10-CM

## 2015-04-06 DIAGNOSIS — Z79899 Other long term (current) drug therapy: Secondary | ICD-10-CM

## 2015-04-06 DIAGNOSIS — R030 Elevated blood-pressure reading, without diagnosis of hypertension: Secondary | ICD-10-CM | POA: Diagnosis not present

## 2015-04-06 DIAGNOSIS — E559 Vitamin D deficiency, unspecified: Secondary | ICD-10-CM | POA: Diagnosis not present

## 2015-04-06 DIAGNOSIS — R7301 Impaired fasting glucose: Secondary | ICD-10-CM

## 2015-04-06 DIAGNOSIS — E781 Pure hyperglyceridemia: Secondary | ICD-10-CM | POA: Diagnosis not present

## 2015-04-06 MED ORDER — METFORMIN HCL 500 MG PO TABS: ORAL_TABLET | ORAL | Status: DC

## 2015-04-07 LAB — BASIC METABOLIC PANEL
BUN / CREAT RATIO: 16 (ref 9–23)
BUN: 13 mg/dL (ref 6–24)
CO2: 24 mmol/L (ref 18–29)
Calcium: 9.5 mg/dL (ref 8.7–10.2)
Chloride: 104 mmol/L (ref 97–108)
Creatinine, Ser: 0.82 mg/dL (ref 0.57–1.00)
GFR calc Af Amer: 96 mL/min/{1.73_m2} (ref >59)
GFR calc non Af Amer: 83 mL/min/{1.73_m2} (ref >59)
Glucose: 99 mg/dL (ref 65–99)
POTASSIUM: 4.3 mmol/L (ref 3.5–5.2)
SODIUM: 140 mmol/L (ref 134–144)

## 2015-04-07 LAB — LIPID PANEL
Chol/HDL Ratio: 3.5 ratio units (ref 0.0–4.4)
Cholesterol, Total: 141 mg/dL (ref 100–199)
HDL: 40 mg/dL (ref >39)
LDL Calculated: 72 mg/dL (ref 0–99)
TRIGLYCERIDES: 144 mg/dL (ref 0–149)
VLDL Cholesterol Cal: 29 mg/dL (ref 5–40)

## 2015-04-07 LAB — HEPATIC FUNCTION PANEL
ALBUMIN: 4.4 g/dL (ref 3.5–5.5)
ALK PHOS: 62 IU/L (ref 39–117)
ALT: 16 IU/L (ref 0–32)
AST: 14 IU/L (ref 0–40)
BILIRUBIN, DIRECT: 0.12 mg/dL (ref 0.00–0.40)
Bilirubin Total: 0.4 mg/dL (ref 0.0–1.2)
Total Protein: 7.2 g/dL (ref 6.0–8.5)

## 2015-04-07 LAB — HEMOGLOBIN A1C
Est. average glucose Bld gHb Est-mCnc: 126 mg/dL
Hgb A1c MFr Bld: 6 % — ABNORMAL HIGH (ref 4.8–5.6)

## 2015-04-07 LAB — VITAMIN D 25 HYDROXY (VIT D DEFICIENCY, FRACTURES): Vit D, 25-Hydroxy: 17.9 ng/mL — ABNORMAL LOW (ref 30.0–100.0)

## 2015-04-08 ENCOUNTER — Encounter: Payer: Self-pay | Admitting: Nurse Practitioner

## 2015-04-08 NOTE — Progress Notes (Signed)
Subjective:  Presents for routine follow up. Taking OTC vitamin D 1000 units per day. FBS this am 110. Has joined a gym but has not started regular exercise yet. No CP/ischemic type pain or SOB. Both parents are diabetic.   Objective:   BP 122/94 mmHg  Ht 5' 5.5" (1.664 m)  Wt 231 lb (104.781 kg)  BMI 37.84 kg/m2 NAD. Alert, oriented. BP on recheck 146/98. Lungs clear. Heart RRR. Large waist circumference/central obesity.    Assessment:  Problem List Items Addressed This Visit      Endocrine   Impaired glucose tolerance   Relevant Orders   Hemoglobin A1c (Completed)   Impaired fasting glucose - Primary   Relevant Orders   Hemoglobin A1c (Completed)     Other   Morbid obesity   Relevant Medications   metFORMIN (GLUCOPHAGE) 500 MG tablet   Hypertriglyceridemia   Relevant Orders   Lipid panel (Completed)   Vitamin D deficiency   Relevant Orders   Vit D  25 hydroxy (rtn osteoporosis monitoring) (Completed)    Other Visit Diagnoses    High risk medication use        Relevant Orders    Hepatic function panel (Completed)    Basic metabolic panel (Completed)    Elevated blood pressure          Plan:  Meds ordered this encounter  Medications  . ONE TOUCH ULTRA TEST test strip    Sig:     Refill:  5  . metFORMIN (GLUCOPHAGE) 500 MG tablet    Sig: TAKE 1 TABLET (500 MG TOTAL) BY MOUTH 2 (TWO) TIMES DAILY WITH A MEAL.    Dispense:  180 tablet    Refill:  1    Order Specific Question:  Supervising Provider    Answer:  Merlyn AlbertLUKING, WILLIAM S [[id number]]   Continue Metformin as directed. Recheck BP at next visit, if remains elevated recommend starting ACE inhibitor. Discussed healthy diet, regular activity and weight loss. Discussed preventive health physical.  Return in about 6 months (around 10/06/2015) for recheck.

## 2015-04-09 ENCOUNTER — Other Ambulatory Visit: Payer: Self-pay | Admitting: Nurse Practitioner

## 2015-04-09 MED ORDER — VITAMIN D (ERGOCALCIFEROL) 1.25 MG (50000 UNIT) PO CAPS: 50000.0000 [IU] | ORAL_CAPSULE | ORAL | Status: DC

## 2015-04-17 ENCOUNTER — Other Ambulatory Visit: Payer: Self-pay | Admitting: Family Medicine

## 2015-05-21 ENCOUNTER — Other Ambulatory Visit: Payer: Self-pay | Admitting: Family Medicine

## 2015-05-30 ENCOUNTER — Other Ambulatory Visit: Payer: Self-pay | Admitting: *Deleted

## 2015-05-30 MED ORDER — GLUCOSE BLOOD VI STRP: ORAL_STRIP | Status: DC

## 2015-06-06 ENCOUNTER — Other Ambulatory Visit: Payer: Self-pay

## 2015-06-06 MED ORDER — ONETOUCH ULTRASOFT LANCETS MISC: Status: DC

## 2015-10-03 ENCOUNTER — Ambulatory Visit: Payer: 59 | Admitting: Nurse Practitioner

## 2015-10-04 ENCOUNTER — Ambulatory Visit: Payer: 59 | Admitting: Nurse Practitioner

## 2015-10-12 ENCOUNTER — Encounter: Payer: Self-pay | Admitting: Nurse Practitioner

## 2015-10-12 ENCOUNTER — Ambulatory Visit (INDEPENDENT_AMBULATORY_CARE_PROVIDER_SITE_OTHER): Payer: 59 | Admitting: Nurse Practitioner

## 2015-10-12 VITALS — BP 122/86 | Ht 65.5 in | Wt 230.0 lb

## 2015-10-12 DIAGNOSIS — R7302 Impaired glucose tolerance (oral): Secondary | ICD-10-CM

## 2015-10-12 DIAGNOSIS — R7301 Impaired fasting glucose: Secondary | ICD-10-CM

## 2015-10-12 MED ORDER — METFORMIN HCL 500 MG PO TABS: ORAL_TABLET | ORAL | Status: DC

## 2015-10-12 NOTE — Progress Notes (Signed)
Subjective:  Presents for routine follow-up. Has been taking OTC vitamin D. Doing pretty good with her diet. Limited exercise. 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 Her mother requires mostly complete care. Blood sugars at home have mostly been below 100.  Objective:   BP 122/86 mmHg  Ht 5' 5.5" (1.664 m)  Wt 230 lb (104.327 kg)  BMI 37.68 kg/m2 NAD. Alert, oriented. Lungs clear. Heart regular rate rhythm. Lower extremities no edema.  Assessment:  Problem List Items Addressed This Visit      Endocrine   Impaired fasting glucose   Impaired glucose tolerance - Primary     Plan:  Meds ordered this encounter  Medications  . metFORMIN (GLUCOPHAGE) 500 MG tablet    Sig: TAKE 1 TABLET (500 MG TOTAL) BY MOUTH 2 (TWO) TIMES DAILY WITH A MEAL.    Dispense:  60 tablet    Refill:  5    Order Specific Question:  Supervising Provider    Answer:  Merlyn AlbertLUKING, WILLIAM S [[id number]]   Continue vitamin D supplement. Encouraged healthy diet and regular exercise. Also discussed importance of stress reduction. Return in about 6 months (around 04/10/2016). Routine yearly lab work at that time.

## 2015-10-17 ENCOUNTER — Other Ambulatory Visit: Payer: Self-pay | Admitting: Nurse Practitioner

## 2015-12-11 ENCOUNTER — Other Ambulatory Visit: Payer: Self-pay | Admitting: Obstetrics and Gynecology

## 2016-04-11 ENCOUNTER — Ambulatory Visit: Payer: 59 | Admitting: Nurse Practitioner

## 2016-04-17 ENCOUNTER — Other Ambulatory Visit: Payer: Self-pay | Admitting: Nurse Practitioner

## 2016-04-17 ENCOUNTER — Encounter: Payer: Self-pay | Admitting: Nurse Practitioner

## 2016-04-17 ENCOUNTER — Ambulatory Visit (INDEPENDENT_AMBULATORY_CARE_PROVIDER_SITE_OTHER): Payer: 59 | Admitting: Nurse Practitioner

## 2016-04-17 VITALS — BP 126/80 | Ht 65.5 in | Wt 234.5 lb

## 2016-04-17 DIAGNOSIS — R7302 Impaired glucose tolerance (oral): Secondary | ICD-10-CM

## 2016-04-17 DIAGNOSIS — E781 Pure hyperglyceridemia: Secondary | ICD-10-CM

## 2016-04-17 DIAGNOSIS — E559 Vitamin D deficiency, unspecified: Secondary | ICD-10-CM

## 2016-04-17 DIAGNOSIS — R7301 Impaired fasting glucose: Secondary | ICD-10-CM

## 2016-04-17 MED ORDER — ONETOUCH ULTRASOFT LANCETS MISC: Status: DC

## 2016-04-17 MED ORDER — METFORMIN HCL 500 MG PO TABS: ORAL_TABLET | ORAL | Status: DC

## 2016-04-17 MED ORDER — GLUCOSE BLOOD VI STRP: ORAL_STRIP | Status: DC

## 2016-04-18 ENCOUNTER — Encounter: Payer: Self-pay | Admitting: Nurse Practitioner

## 2016-04-18 LAB — HEPATIC FUNCTION PANEL
ALT: 18 IU/L (ref 0–32)
AST: 18 IU/L (ref 0–40)
Albumin: 4.1 g/dL (ref 3.5–5.5)
Alkaline Phosphatase: 57 IU/L (ref 39–117)
BILIRUBIN TOTAL: 0.7 mg/dL (ref 0.0–1.2)
BILIRUBIN, DIRECT: 0.15 mg/dL (ref 0.00–0.40)
TOTAL PROTEIN: 6.9 g/dL (ref 6.0–8.5)

## 2016-04-18 LAB — BASIC METABOLIC PANEL
BUN/Creatinine Ratio: 14 (ref 9–23)
BUN: 12 mg/dL (ref 6–24)
CO2: 23 mmol/L (ref 18–29)
Calcium: 9 mg/dL (ref 8.7–10.2)
Chloride: 102 mmol/L (ref 96–106)
Creatinine, Ser: 0.86 mg/dL (ref 0.57–1.00)
GFR, EST AFRICAN AMERICAN: 90 mL/min/{1.73_m2} (ref >59)
GFR, EST NON AFRICAN AMERICAN: 78 mL/min/{1.73_m2} (ref >59)
GLUCOSE: 91 mg/dL (ref 65–99)
Potassium: 4.1 mmol/L (ref 3.5–5.2)
SODIUM: 140 mmol/L (ref 134–144)

## 2016-04-18 LAB — LIPID PANEL
CHOL/HDL RATIO: 3.9 ratio (ref 0.0–4.4)
Cholesterol, Total: 147 mg/dL (ref 100–199)
HDL: 38 mg/dL — AB (ref >39)
LDL CALC: 71 mg/dL (ref 0–99)
Triglycerides: 190 mg/dL — ABNORMAL HIGH (ref 0–149)
VLDL Cholesterol Cal: 38 mg/dL (ref 5–40)

## 2016-04-18 LAB — VITAMIN D 25 HYDROXY (VIT D DEFICIENCY, FRACTURES): VIT D 25 HYDROXY: 19.5 ng/mL — AB (ref 30.0–100.0)

## 2016-04-18 NOTE — Progress Notes (Signed)
Subjective:  Presents for routine follow-up. Doing well with her diet. Not very active at this point. No chest pain/ischemic type pain or shortness of breath. Gets regular physicals with gynecology including her Pap smear and mammogram.  Objective:   BP 126/80 mmHg  Ht 5' 5.5" (1.664 m)  Wt 234 lb 8 oz (106.369 kg)  BMI 38.42 kg/m2 NAD. Alert, oriented. Lungs clear. Heart regular rate rhythm. Lower extremities no edema.  Assessment:  Problem List Items Addressed This Visit      Endocrine   Impaired fasting glucose   Relevant Orders   Lipid panel   Hepatic function panel   Basic metabolic panel   VITAMIN D 25 Hydroxy (Vit-D Deficiency, Fractures)   Impaired glucose tolerance - Primary   Relevant Orders   Lipid panel   Hepatic function panel   Basic metabolic panel   VITAMIN D 25 Hydroxy (Vit-D Deficiency, Fractures)     Other   Hypertriglyceridemia   Relevant Orders   Lipid panel   Hepatic function panel   Basic metabolic panel   VITAMIN D 25 Hydroxy (Vit-D Deficiency, Fractures)   Vitamin D deficiency   Relevant Orders   Lipid panel   Hepatic function panel   Basic metabolic panel   VITAMIN D 25 Hydroxy (Vit-D Deficiency, Fractures)     Plan:  Meds ordered this encounter  Medications  . glucose blood (ONE TOUCH ULTRA TEST) test strip    Sig: TEST BLOOD SUGAR DAILY    Dispense:  50 each    Refill:  5    Order Specific Question:  Supervising Provider    Answer:  Merlyn AlbertLUKING, WILLIAM S [[id number]]  . Lancets (ONETOUCH ULTRASOFT) lancets    Sig: Use daily as instructed    Dispense:  100 each    Refill:  5    Order Specific Question:  Supervising Provider    Answer:  Merlyn AlbertLUKING, WILLIAM S [[id number]]  . metFORMIN (GLUCOPHAGE) 500 MG tablet    Sig: TAKE 1 TABLET (500 MG TOTAL) BY MOUTH 2 (TWO) TIMES DAILY WITH A MEAL.    Dispense:  90 tablet    Refill:  1    Order Specific Question:  Supervising Provider    Answer:  Merlyn AlbertLUKING, WILLIAM S [[id number]]   Continue metformin as directed. Lab  work pending. Return in about 6 months (around 10/18/2016) for recheck. It was noted after visit A1c was not included, obtain in the office at next visit. Encouraged regular activity. Set a goal for 20 minutes at least 4 days a week.

## 2016-04-22 ENCOUNTER — Encounter: Payer: Self-pay | Admitting: Nurse Practitioner

## 2016-04-22 ENCOUNTER — Other Ambulatory Visit: Payer: Self-pay | Admitting: Nurse Practitioner

## 2016-04-22 ENCOUNTER — Telehealth: Payer: Self-pay | Admitting: Family Medicine

## 2016-04-22 MED ORDER — VITAMIN D (ERGOCALCIFEROL) 1.25 MG (50000 UNIT) PO CAPS: 50000.0000 [IU] | ORAL_CAPSULE | ORAL | Status: DC

## 2016-04-22 NOTE — Telephone Encounter (Signed)
Pt wants to know why a A1C was not ran on these last labs

## 2016-04-22 NOTE — Telephone Encounter (Signed)
Somewhat bizarrely same question sent to carolyn via pt e mail at about same time, route this towards caroly so pt gets unified answer

## 2016-04-25 NOTE — Telephone Encounter (Signed)
Nurses: I sent a message through my chart. If she wants A1C done now, we can schedule at nurse visit (no charge please) to check A1C. Otherwise, it is fine to wait until next visit.

## 2016-04-25 NOTE — Telephone Encounter (Signed)
TCNA 

## 2016-04-29 NOTE — Telephone Encounter (Signed)
Left message to return call 

## 2016-05-01 NOTE — Telephone Encounter (Signed)
Notified patient Sharon JonesCarolyn sent a message through my chart. If she wants A1C done now, we can schedule at nurse visit (no charge please) to check A1C. Otherwise, it is fine to wait until next visit. Patient states that she will wait until her next visit.

## 2016-05-03 ENCOUNTER — Other Ambulatory Visit: Payer: Self-pay | Admitting: Nurse Practitioner

## 2016-05-09 ENCOUNTER — Encounter: Payer: Self-pay | Admitting: Nurse Practitioner

## 2016-05-09 ENCOUNTER — Ambulatory Visit (INDEPENDENT_AMBULATORY_CARE_PROVIDER_SITE_OTHER): Payer: 59 | Admitting: Nurse Practitioner

## 2016-05-09 VITALS — BP 144/98 | Ht 65.5 in | Wt 234.0 lb

## 2016-05-09 DIAGNOSIS — I10 Essential (primary) hypertension: Secondary | ICD-10-CM

## 2016-05-09 DIAGNOSIS — R7301 Impaired fasting glucose: Secondary | ICD-10-CM

## 2016-05-09 LAB — POCT GLYCOSYLATED HEMOGLOBIN (HGB A1C): Hemoglobin A1C: 5.4

## 2016-05-09 MED ORDER — LISINOPRIL 10 MG PO TABS: 10.0000 mg | ORAL_TABLET | Freq: Every day | ORAL | Status: DC

## 2016-05-09 NOTE — Progress Notes (Signed)
Subjective:  Presents to discuss her blood pressure. BP has been running higher than usual for over a week. Took her BP at home using a wrist cuff this morning 150/102. BP at work has been running 140s over 90s. No change in her diet. No change in salt intake. Stress is unchanged. No edema. No chest pain/ischemic type pain or shortness of breath. Has felt hot at times. Minimal headache.  Objective:   BP 144/98 mmHg  Ht 5' 5.5" (1.664 m)  Wt 234 lb (106.142 kg)  BMI 38.33 kg/m2 NAD. Alert, oriented. Lungs clear. Heart regular rate rhythm. Faint grade 2/6 early systolic flow murmur noted loudest at PMI. Carotids no bruits or thrills. Lower extremities no edema. BP on recheck left arm sitting 160/108 and right arm 170/106.  Assessment:  Problem List Items Addressed This Visit      Cardiovascular and Mediastinum   Essential hypertension, benign - Primary   Relevant Medications   lisinopril (PRINIVIL,ZESTRIL) 10 MG tablet     Endocrine   Impaired fasting glucose   Relevant Orders   POCT glycosylated hemoglobin (Hb A1C) (Completed)     Plan:  Meds ordered this encounter  Medications  . lisinopril (PRINIVIL,ZESTRIL) 10 MG tablet    Sig: Take 1 tablet (10 mg total) by mouth daily.    Dispense:  90 tablet    Refill:  1    Order Specific Question:  Supervising Provider    Answer:  Merlyn AlbertLUKING, WILLIAM S [[id number]]   Add lisinopril to regimen for BP and also for prediabetes. Reviewed potential adverse effects including allergic reaction. DC med and call if any problems. Continue to monitor BP outside the office and to call back in 2-3 weeks if BP is not significantly improved. Return in about 3 months (around 08/09/2016) for BP recheck.

## 2016-05-22 ENCOUNTER — Encounter: Payer: Self-pay | Admitting: Nurse Practitioner

## 2016-05-23 ENCOUNTER — Other Ambulatory Visit: Payer: Self-pay | Admitting: Nurse Practitioner

## 2016-05-23 MED ORDER — HYDROCHLOROTHIAZIDE 25 MG PO TABS: 25.0000 mg | ORAL_TABLET | Freq: Every day | ORAL | Status: DC

## 2016-08-12 ENCOUNTER — Ambulatory Visit: Payer: 59 | Admitting: Nurse Practitioner

## 2016-08-27 ENCOUNTER — Other Ambulatory Visit: Payer: Self-pay | Admitting: Nurse Practitioner

## 2016-10-17 ENCOUNTER — Ambulatory Visit (INDEPENDENT_AMBULATORY_CARE_PROVIDER_SITE_OTHER): Payer: 59 | Admitting: Nurse Practitioner

## 2016-10-17 ENCOUNTER — Encounter: Payer: Self-pay | Admitting: Nurse Practitioner

## 2016-10-17 VITALS — BP 128/82 | Ht 65.5 in | Wt 229.8 lb

## 2016-10-17 DIAGNOSIS — R7301 Impaired fasting glucose: Secondary | ICD-10-CM | POA: Diagnosis not present

## 2016-10-17 DIAGNOSIS — K219 Gastro-esophageal reflux disease without esophagitis: Secondary | ICD-10-CM | POA: Diagnosis not present

## 2016-10-17 DIAGNOSIS — I10 Essential (primary) hypertension: Secondary | ICD-10-CM

## 2016-10-17 LAB — POCT GLYCOSYLATED HEMOGLOBIN (HGB A1C): Hemoglobin A1C: 6.1

## 2016-10-17 MED ORDER — ACYCLOVIR 400 MG PO TABS: 400.0000 mg | ORAL_TABLET | Freq: Three times a day (TID) | ORAL | 5 refills | Status: DC

## 2016-10-17 MED ORDER — PANTOPRAZOLE SODIUM 40 MG PO TBEC: 40.0000 mg | DELAYED_RELEASE_TABLET | Freq: Every day | ORAL | 2 refills | Status: DC

## 2016-10-18 ENCOUNTER — Encounter: Payer: Self-pay | Admitting: Nurse Practitioner

## 2016-10-18 DIAGNOSIS — K219 Gastro-esophageal reflux disease without esophagitis: Secondary | ICD-10-CM | POA: Insufficient documentation

## 2016-10-18 HISTORY — DX: Gastro-esophageal reflux disease without esophagitis: K21.9

## 2016-10-18 NOTE — Progress Notes (Signed)
Subjective:  Presents for routine follow-up. Has an active job. Has cut back on fast food intake. Has also decreased her portion size. Average fasting glucose at home running 103-130. No chest pain/ischemic type pain or shortness of breath. Gets regular gynecological physicals and mammograms. Has a history of acid reflux and heartburn. According to patient has been on omeprazole in the past. Has noticed a flareup over the past 5-6 months. Now having heartburn unrelated to any specific foods that occurs after eating. Also worse if she eats late in the evening. Better after eating 2 TUMS. No abdominal pain. Bowels normal limit. Drinks one cup of coffee a day. Does eat some chocolate. Denies any excessive spicy food intake. Nonsmoker. Rare alcohol intake. No excessive anti-inflammatory intake. Has been under a lot more stress at work.  Objective:   BP 128/82   Ht 5' 5.5" (1.664 m)   Wt 229 lb 12.8 oz (104.2 kg)   BMI 37.66 kg/m  NAD. Alert, oriented. Lungs clear. Heart regular rhythm. Abdomen soft nondistended nontender. Results for orders placed or performed in visit on 10/17/16  POCT glycosylated hemoglobin (Hb A1C)  Result Value Ref Range   Hemoglobin A1C 6.1     Hemoglobin A1c is up from 5.4 in June.  Assessment:  Problem List Items Addressed This Visit      Cardiovascular and Mediastinum   Essential hypertension, benign     Digestive   Gastroesophageal reflux disease without esophagitis - Primary   Relevant Medications   pantoprazole (PROTONIX) 40 MG tablet     Endocrine   Impaired fasting glucose   Relevant Orders   POCT glycosylated hemoglobin (Hb A1C) (Completed)     Plan:  Meds ordered this encounter  Medications  . pantoprazole (PROTONIX) 40 MG tablet    Sig: Take 1 tablet (40 mg total) by mouth daily.    Dispense:  30 tablet    Refill:  2    Order Specific Question:   Supervising Provider    Answer:   Merlyn AlbertLUKING, WILLIAM S [[id number]]  . acyclovir (ZOVIRAX) 400 MG tablet   Sig: Take 1 tablet (400 mg total) by mouth 3 (three) times daily. X 5 d prn    Dispense:  15 tablet    Refill:  5    Order Specific Question:   Supervising Provider    Answer:   Riccardo DubinLUKING, WILLIAM S [[id number]]   Encourage patient to cut back on sugar and simple carbs. Also encouraged weight loss. Restart PPI for her reflux, take for the next several weeks, call back if symptoms persist. After this may switch to when necessary basis if needed. Return in about 6 months (around 04/16/2017) for recheck.

## 2016-11-10 ENCOUNTER — Other Ambulatory Visit: Payer: Self-pay | Admitting: Nurse Practitioner

## 2016-11-19 ENCOUNTER — Other Ambulatory Visit: Payer: Self-pay | Admitting: *Deleted

## 2016-11-19 MED ORDER — PANTOPRAZOLE SODIUM 40 MG PO TBEC: 40.0000 mg | DELAYED_RELEASE_TABLET | Freq: Every day | ORAL | 1 refills | Status: DC

## 2016-12-09 ENCOUNTER — Other Ambulatory Visit: Payer: Self-pay | Admitting: Nurse Practitioner

## 2016-12-14 ENCOUNTER — Other Ambulatory Visit: Payer: Self-pay | Admitting: Family Medicine

## 2016-12-27 LAB — HM DIABETES EYE EXAM

## 2017-02-20 ENCOUNTER — Encounter: Payer: Self-pay | Admitting: *Deleted

## 2017-04-04 ENCOUNTER — Other Ambulatory Visit: Payer: Self-pay | Admitting: Family Medicine

## 2017-04-16 ENCOUNTER — Ambulatory Visit: Payer: 59 | Admitting: Nurse Practitioner

## 2017-04-24 ENCOUNTER — Ambulatory Visit: Payer: 59 | Admitting: Nurse Practitioner

## 2017-05-08 ENCOUNTER — Ambulatory Visit: Payer: 59 | Admitting: Nurse Practitioner

## 2017-05-09 ENCOUNTER — Other Ambulatory Visit: Payer: Self-pay | Admitting: Family Medicine

## 2017-05-11 ENCOUNTER — Ambulatory Visit (INDEPENDENT_AMBULATORY_CARE_PROVIDER_SITE_OTHER): Payer: 59 | Admitting: Nurse Practitioner

## 2017-05-11 ENCOUNTER — Encounter: Payer: Self-pay | Admitting: Nurse Practitioner

## 2017-05-11 VITALS — BP 118/74 | Ht 65.5 in | Wt 226.0 lb

## 2017-05-11 DIAGNOSIS — K219 Gastro-esophageal reflux disease without esophagitis: Secondary | ICD-10-CM

## 2017-05-11 DIAGNOSIS — I10 Essential (primary) hypertension: Secondary | ICD-10-CM | POA: Diagnosis not present

## 2017-05-11 DIAGNOSIS — Z79899 Other long term (current) drug therapy: Secondary | ICD-10-CM | POA: Diagnosis not present

## 2017-05-11 DIAGNOSIS — E781 Pure hyperglyceridemia: Secondary | ICD-10-CM

## 2017-05-11 DIAGNOSIS — R7301 Impaired fasting glucose: Secondary | ICD-10-CM | POA: Diagnosis not present

## 2017-05-11 DIAGNOSIS — E559 Vitamin D deficiency, unspecified: Secondary | ICD-10-CM | POA: Diagnosis not present

## 2017-05-11 LAB — POCT GLYCOSYLATED HEMOGLOBIN (HGB A1C): Hemoglobin A1C: 5.5

## 2017-05-11 NOTE — Progress Notes (Signed)
Subjective:  Presents for recheck on chronic health issues. Doing better with diet. Drinking alkaline water and regular water. Rare sugary beverages. Walking mainly at work which has become an issue to due left knee pain. Seeing Dr. Charlann Boxerlin soon for possible knee surgery. Stopped taking Protonix but started back due to GERD; symptoms now stable. Takes daily MVI with vitamin D. Gets regular preventive health physicals and mammograms with gyn. No SOB, chest pain. Minimal edema mainly left ankle. Gets yearly eye exams.   Objective:   BP 118/74   Ht 5' 5.5" (1.664 m)   Wt 226 lb (102.5 kg)   BMI 37.04 kg/m  NAD. Alert, oriented. Lungs clear. Heart RRR. Abdomen soft, non distended, non tender. LE: trace pitting edema.  Results for orders placed or performed in visit on 05/11/17  POCT glycosylated hemoglobin (Hb A1C)  Result Value Ref Range   Hemoglobin A1C 5.5      Assessment:   Problem List Items Addressed This Visit      Cardiovascular and Mediastinum   Essential hypertension, benign   Relevant Orders   Basic metabolic panel     Digestive   Gastroesophageal reflux disease without esophagitis     Endocrine   Impaired fasting glucose - Primary   Relevant Orders   POCT glycosylated hemoglobin (Hb A1C) (Completed)     Other   Hypertriglyceridemia   Relevant Orders   Lipid panel   Morbid obesity (HCC)   Vitamin D deficiency   Relevant Orders   VITAMIN D 25 Hydroxy (Vit-D Deficiency, Fractures)    Other Visit Diagnoses    High risk medication use       Relevant Orders   Hepatic function panel       Plan:  Continue current meds. Continue weight loss efforts. [place] form for temporary handicapped placard.  Return in about 6 months (around 11/10/2017) for recheck.

## 2017-05-12 ENCOUNTER — Other Ambulatory Visit: Payer: Self-pay | Admitting: Nurse Practitioner

## 2017-05-12 LAB — BASIC METABOLIC PANEL
BUN/Creatinine Ratio: 18 (ref 9–23)
BUN: 18 mg/dL (ref 6–24)
CO2: 26 mmol/L (ref 18–29)
Calcium: 9.8 mg/dL (ref 8.7–10.2)
Chloride: 101 mmol/L (ref 96–106)
Creatinine, Ser: 1 mg/dL (ref 0.57–1.00)
GFR calc non Af Amer: 64 mL/min/{1.73_m2} (ref >59)
GFR, EST AFRICAN AMERICAN: 74 mL/min/{1.73_m2} (ref >59)
Glucose: 96 mg/dL (ref 65–99)
Potassium: 3.8 mmol/L (ref 3.5–5.2)
Sodium: 138 mmol/L (ref 134–144)

## 2017-05-12 LAB — HEPATIC FUNCTION PANEL
ALT: 17 IU/L (ref 0–32)
AST: 18 IU/L (ref 0–40)
Albumin: 4.4 g/dL (ref 3.5–5.5)
Alkaline Phosphatase: 63 IU/L (ref 39–117)
BILIRUBIN TOTAL: 0.5 mg/dL (ref 0.0–1.2)
BILIRUBIN, DIRECT: 0.12 mg/dL (ref 0.00–0.40)
Total Protein: 7.1 g/dL (ref 6.0–8.5)

## 2017-05-12 LAB — LIPID PANEL
CHOLESTEROL TOTAL: 150 mg/dL (ref 100–199)
Chol/HDL Ratio: 4.7 ratio — ABNORMAL HIGH (ref 0.0–4.4)
HDL: 32 mg/dL — ABNORMAL LOW (ref >39)
LDL Calculated: 85 mg/dL (ref 0–99)
Triglycerides: 167 mg/dL — ABNORMAL HIGH (ref 0–149)
VLDL Cholesterol Cal: 33 mg/dL (ref 5–40)

## 2017-05-12 LAB — VITAMIN D 25 HYDROXY (VIT D DEFICIENCY, FRACTURES): Vit D, 25-Hydroxy: 26.3 ng/mL — ABNORMAL LOW (ref 30.0–100.0)

## 2017-05-28 ENCOUNTER — Other Ambulatory Visit: Payer: Self-pay | Admitting: Nurse Practitioner

## 2017-06-22 ENCOUNTER — Ambulatory Visit (INDEPENDENT_AMBULATORY_CARE_PROVIDER_SITE_OTHER): Payer: 59

## 2017-06-22 ENCOUNTER — Ambulatory Visit (INDEPENDENT_AMBULATORY_CARE_PROVIDER_SITE_OTHER): Payer: 59 | Admitting: Podiatry

## 2017-06-22 DIAGNOSIS — M79672 Pain in left foot: Secondary | ICD-10-CM

## 2017-06-22 DIAGNOSIS — M722 Plantar fascial fibromatosis: Secondary | ICD-10-CM

## 2017-06-23 ENCOUNTER — Telehealth: Payer: Self-pay | Admitting: Podiatry

## 2017-06-23 ENCOUNTER — Other Ambulatory Visit: Payer: Self-pay | Admitting: Family Medicine

## 2017-06-23 MED ORDER — DICLOFENAC SODIUM 75 MG PO TBEC: 75.0000 mg | DELAYED_RELEASE_TABLET | Freq: Two times a day (BID) | ORAL | 1 refills | Status: DC

## 2017-06-23 NOTE — Telephone Encounter (Signed)
Sorry. Yes, please prescribe Diclofenac 75mg  #60 BID 1 refill.  Thanks, Dr. Logan BoresEvans

## 2017-06-23 NOTE — Telephone Encounter (Signed)
I was seen yesterday and the doctor was supposed to prescribe and anti inflammatory medication for me to take. I checked with my pharmacy this morning and they have not received anything.

## 2017-06-23 NOTE — Telephone Encounter (Signed)
Left message informing pt Dr.Evans has ordered an antiinflammatory medication at [id number].

## 2017-06-29 MED ORDER — BETAMETHASONE SOD PHOS & ACET 6 (3-3) MG/ML IJ SUSP: 3.0000 mg | Freq: Once | INTRAMUSCULAR | Status: DC

## 2017-06-29 NOTE — Progress Notes (Signed)
   Subjective: Patient presents today for pain and tenderness in the left foot. Patient states the foot pain has been hurting for several weeks now. Patient states that it hurts in the mornings with the first steps out of bed. Patient presents today for further treatment and evaluation  Objective: Physical Exam General: The patient is alert and oriented x3 in no acute distress.  Dermatology: Skin is warm, dry and supple bilateral lower extremities. Negative for open lesions or macerations bilateral.   Vascular: Dorsalis Pedis and Posterior Tibial pulses palpable bilateral.  Capillary fill time is immediate to all digits.  Neurological: Epicritic and protective threshold intact bilateral.   Musculoskeletal: Tenderness to palpation at the medial calcaneal tubercale and through the insertion of the plantar fascia of the left foot. All other joints range of motion within normal limits bilateral. Strength 5/5 in all groups bilateral.   Radiographic exam:   Normal osseous mineralization. Joint spaces preserved. No fracture/dislocation/boney destruction. Calcaneal spur present with mild thickening of plantar fascia left. No other soft tissue abnormalities or radiopaque foreign bodies.   Assessment: 1. Plantar fasciitis left foot  Plan of Care:  1. Patient evaluated. Xrays reviewed.   2. Injection of 0.5cc Celestone soluspan injected into the left plantar fascia.  3. Rx for Diclofenac 75mg PO BID ordered for patient. 4. Plantar fascial band(s) dispensed  5. Instructed patient regarding therapies and modalities at home to alleviate symptoms.  6. Return to clinic in 4 weeks.     Laraine Samet M. Dameisha Tschida, DPM [facility]  Dr. Jaxon Flatt M. Ananda Sitzer, DPM    [address]                                     [place], [place] [address]                Office [phone]  Fax [phone]      

## 2017-07-19 ENCOUNTER — Other Ambulatory Visit: Payer: Self-pay | Admitting: Family Medicine

## 2017-07-20 ENCOUNTER — Ambulatory Visit: Payer: 59 | Admitting: Podiatry

## 2017-08-05 ENCOUNTER — Ambulatory Visit: Payer: 59 | Admitting: Podiatry

## 2017-08-19 ENCOUNTER — Ambulatory Visit (INDEPENDENT_AMBULATORY_CARE_PROVIDER_SITE_OTHER): Payer: 59 | Admitting: Podiatry

## 2017-08-19 ENCOUNTER — Encounter: Payer: Self-pay | Admitting: Podiatry

## 2017-08-19 DIAGNOSIS — M722 Plantar fascial fibromatosis: Secondary | ICD-10-CM | POA: Diagnosis not present

## 2017-08-19 MED ORDER — BETAMETHASONE SOD PHOS & ACET 6 (3-3) MG/ML IJ SUSP: 3.0000 mg | Freq: Once | INTRAMUSCULAR | Status: DC

## 2017-08-21 NOTE — Progress Notes (Signed)
   Subjective: Patient presents today for follow up evaluation of pain and tenderness in the left foot. She states the injection she received at the previous visit helped alleviate the pain for about 2-3 weeks but it has now returned. She reports taking Diclofenac as needed with some relief as well. Patient presents today for further treatment and evaluation  Past Medical History:  Diagnosis Date  . Allergy   . Arthritis   . Impaired fasting glucose   . Sebaceous cyst of breast 2012   left breast    Objective: Physical Exam General: The patient is alert and oriented x3 in no acute distress.  Dermatology: Skin is warm, dry and supple bilateral lower extremities. Negative for open lesions or macerations bilateral.   Vascular: Dorsalis Pedis and Posterior Tibial pulses palpable bilateral.  Capillary fill time is immediate to all digits.  Neurological: Epicritic and protective threshold intact bilateral.   Musculoskeletal: Tenderness to palpation at the medial calcaneal tubercale and through the insertion of the plantar fascia of the left foot. All other joints range of motion within normal limits bilateral. Strength 5/5 in all groups bilateral.   Assessment: 1. Plantar fasciitis left foot  Plan of Care:  1. Patient evaluated.   2. Injection of 0.5cc Celestone soluspan injected into the left plantar fascia.  3. Continue Diclofenac once daily. 4. Discontinue wearing brace. 5. Continue wearing Sketcher's sneakers.  6. Return to clinic in 4 weeks.     Felecia Shelling, DPM [facility]  Dr. Felecia Shelling, DPM    [address] [place], [place] [address]                Office [phone]  Fax [phone]

## 2017-08-25 ENCOUNTER — Telehealth: Payer: Self-pay | Admitting: *Deleted

## 2017-08-25 MED ORDER — DICLOFENAC SODIUM 75 MG PO TBEC: 75.0000 mg | DELAYED_RELEASE_TABLET | Freq: Two times a day (BID) | ORAL | 0 refills | Status: DC

## 2017-08-25 NOTE — Telephone Encounter (Signed)
Received refill request for diclofenac. Dr. Everlena Cooper the refill of the Diclofenac.

## 2017-09-16 ENCOUNTER — Encounter: Payer: Self-pay | Admitting: Podiatry

## 2017-09-16 ENCOUNTER — Ambulatory Visit (INDEPENDENT_AMBULATORY_CARE_PROVIDER_SITE_OTHER): Payer: 59 | Admitting: Podiatry

## 2017-09-16 DIAGNOSIS — M722 Plantar fascial fibromatosis: Secondary | ICD-10-CM

## 2017-09-22 NOTE — Progress Notes (Signed)
   Subjective: Patient presents today for follow up evaluation of pain and tenderness in the left foot. She states she is doing better but reports some continued soreness in the left arch. She has been taking Diclofenac with some relief of the pain. Patient presents today for further treatment and evaluation.    Past Medical History:  Diagnosis Date  . Allergy   . Arthritis   . Impaired fasting glucose   . Sebaceous cyst of breast 2012   left breast    Objective: Physical Exam General: The patient is alert and oriented x3 in no acute distress.  Dermatology: Skin is warm, dry and supple bilateral lower extremities. Negative for open lesions or macerations bilateral.   Vascular: Dorsalis Pedis and Posterior Tibial pulses palpable bilateral.  Capillary fill time is immediate to all digits.  Neurological: Epicritic and protective threshold intact bilateral.   Musculoskeletal: Tenderness to palpation at the medial calcaneal tubercale and through the insertion of the plantar fascia of the left foot. All other joints range of motion within normal limits bilateral. Strength 5/5 in all groups bilateral.   Assessment: 1. Plantar fasciitis left foot  Plan of Care:  1. Patient evaluated.   2. Continue wearing Sketcher's shoe gear. 3. Recommended Superfeet insoles from [employer]. 4. Continue taking Diclofenac when necessary. 5. Return to clinic when necessary.   Felecia Shelling, DPM [facility]  Dr. Felecia Shelling, DPM    [address] [place], [place] [address]                Office [phone]  Fax [phone]

## 2017-10-16 ENCOUNTER — Telehealth: Payer: Self-pay | Admitting: *Deleted

## 2017-10-16 MED ORDER — DICLOFENAC SODIUM 75 MG PO TBEC: 75.0000 mg | DELAYED_RELEASE_TABLET | Freq: Two times a day (BID) | ORAL | 0 refills | Status: DC

## 2017-10-16 NOTE — Telephone Encounter (Signed)
Refill request for diclofenac. Dr. Logan BoresEvans ordered refill once then pt needs an appt prior to refills.

## 2017-11-05 ENCOUNTER — Other Ambulatory Visit: Payer: Self-pay | Admitting: Nurse Practitioner

## 2017-11-10 ENCOUNTER — Encounter: Payer: Self-pay | Admitting: Nurse Practitioner

## 2017-11-10 ENCOUNTER — Ambulatory Visit: Payer: 59 | Admitting: Nurse Practitioner

## 2017-11-10 VITALS — BP 122/82 | Ht 65.5 in | Wt 229.4 lb

## 2017-11-10 DIAGNOSIS — K219 Gastro-esophageal reflux disease without esophagitis: Secondary | ICD-10-CM

## 2017-11-10 DIAGNOSIS — I10 Essential (primary) hypertension: Secondary | ICD-10-CM | POA: Diagnosis not present

## 2017-11-10 DIAGNOSIS — E876 Hypokalemia: Secondary | ICD-10-CM | POA: Diagnosis not present

## 2017-11-10 DIAGNOSIS — R7301 Impaired fasting glucose: Secondary | ICD-10-CM

## 2017-11-10 MED ORDER — POTASSIUM CHLORIDE CRYS ER 10 MEQ PO TBCR: 10.0000 meq | EXTENDED_RELEASE_TABLET | Freq: Every day | ORAL | 1 refills | Status: DC

## 2017-11-10 NOTE — Progress Notes (Signed)
Subjective: Presents for recheck on her chronic health issues.  Compliant with medications.  Had lab work done recently through orthopedic specialist.  Gets her mammogram and Pap smear through her gynecologist, has appointment coming up in the near future.  Just got a second opinion on her left knee, same diagnoses and surgery is still recommended.  Is looking at getting surgery done 12/22/17.  Decreased activity due to knee pain.  No chest pain/ischemic type pain or shortness of breath.  No TIA symptomatology.  Reflux symptoms stable on Protonix.  Sugars at home running 90 to low 100s.  Objective:   BP 122/82   Ht 5' 5.5" (1.664 m)   Wt 229 lb 6.4 oz (104.1 kg)   BMI 37.59 kg/m  NAD.  Alert, oriented.  Lungs clear.  Heart regular rate and rhythm.  Carotids no bruits or thrills.  Abdomen soft nondistended nontender. Potassium on her most recent lab work in mid November was 3.2 and her A1c was 5.8.  See lab results in chart.  Assessment:   Problem List Items Addressed This Visit      Cardiovascular and Mediastinum   Essential hypertension, benign - Primary     Digestive   Gastroesophageal reflux disease without esophagitis     Endocrine   Impaired fasting glucose     Other   Morbid obesity (HCC)    Other Visit Diagnoses    Hypokalemia           Plan:   Meds ordered this encounter  Medications  . potassium chloride (K-DUR,KLOR-CON) 10 MEQ tablet    Sig: Take 1 tablet (10 mEq total) by mouth daily.    Dispense:  90 tablet    Refill:  1    Order Specific Question:   Supervising Provider    Answer:   Merlyn AlbertLUKING, WILLIAM S [[id number]]   Start low-dose potassium supplement.  Patient will be having preop lab work, Careers advisersurgeon will most likely recheck at that time.  Continue other medications as directed.  Discussed importance of regular activity as tolerated especially after her surgery.  Recommend weight loss of about 10 pounds.  No other labs indicated at this time.  Discussed  potential risks  associated with chronic PPI use.  Had her flu vaccine at work. Return in about 6 months (around 05/11/2018) for routine follow up.

## 2017-11-16 ENCOUNTER — Other Ambulatory Visit: Payer: Self-pay | Admitting: Nurse Practitioner

## 2017-11-23 ENCOUNTER — Other Ambulatory Visit: Payer: Self-pay | Admitting: Family Medicine

## 2017-12-06 ENCOUNTER — Other Ambulatory Visit: Payer: Self-pay | Admitting: Family Medicine

## 2017-12-21 ENCOUNTER — Ambulatory Visit (INDEPENDENT_AMBULATORY_CARE_PROVIDER_SITE_OTHER): Payer: 59

## 2017-12-21 ENCOUNTER — Ambulatory Visit: Payer: 59 | Admitting: Podiatry

## 2017-12-21 ENCOUNTER — Encounter: Payer: Self-pay | Admitting: Podiatry

## 2017-12-21 DIAGNOSIS — M7752 Other enthesopathy of left foot: Secondary | ICD-10-CM

## 2017-12-21 DIAGNOSIS — M778 Other enthesopathies, not elsewhere classified: Secondary | ICD-10-CM

## 2017-12-21 DIAGNOSIS — R52 Pain, unspecified: Secondary | ICD-10-CM

## 2017-12-21 DIAGNOSIS — M779 Enthesopathy, unspecified: Secondary | ICD-10-CM

## 2017-12-21 MED ORDER — DICLOFENAC SODIUM 75 MG PO TBEC: 75.0000 mg | DELAYED_RELEASE_TABLET | Freq: Two times a day (BID) | ORAL | 1 refills | Status: DC

## 2017-12-22 ENCOUNTER — Other Ambulatory Visit: Payer: Self-pay | Admitting: Podiatry

## 2017-12-22 DIAGNOSIS — M779 Enthesopathy, unspecified: Principal | ICD-10-CM

## 2017-12-22 DIAGNOSIS — M778 Other enthesopathies, not elsewhere classified: Secondary | ICD-10-CM

## 2017-12-23 NOTE — Progress Notes (Signed)
   HPI: 52 year old female presenting today for pain to the dorsal and plantar forefoot that began about one month ago. She reports associated radiating pain to the toes. Walking increases the pain. She has taken Diclofenac with some relief of the pain. Patient is here for further evaluation and treatment.   Past Medical History:  Diagnosis Date  . Allergy   . Arthritis   . Impaired fasting glucose   . Sebaceous cyst of breast 2012   left breast     Physical Exam: General: The patient is alert and oriented x3 in no acute distress.  Dermatology: Skin is warm, dry and supple bilateral lower extremities. Negative for open lesions or macerations.  Vascular: Palpable pedal pulses bilaterally. No edema or erythema noted. Capillary refill within normal limits.  Neurological: Epicritic and protective threshold grossly intact bilaterally.   Musculoskeletal Exam: Pain with palpation to the 3rd MPJ of the left foot. Range of motion within normal limits to all pedal and ankle joints bilateral. Muscle strength 5/5 in all groups bilateral.   Radiographic Exam:  Normal osseous mineralization. Joint spaces preserved. No fracture/dislocation/boney destruction.    Assessment: - 3rd MPJ capsulitis left   Plan of Care:  - Patient evaluated. X-Rays reviewed. - Injection of 0.5 mLs Celestone Soluspan injected into the 3rd MPJ of the left foot. - Met pads dispensed. - refill prescription for Diclofenac provided to patient.  - Return to clinic as needed.    Edrick Kins, DPM [facility]  Dr. Edrick Kins, DPM    [address], [place] [address]                Office [phone]  Fax [phone]

## 2018-01-20 ENCOUNTER — Other Ambulatory Visit: Payer: Self-pay | Admitting: Family Medicine

## 2018-02-04 ENCOUNTER — Other Ambulatory Visit: Payer: Self-pay | Admitting: Nurse Practitioner

## 2018-04-08 ENCOUNTER — Ambulatory Visit: Payer: 59 | Admitting: Nurse Practitioner

## 2018-04-28 ENCOUNTER — Other Ambulatory Visit: Payer: Self-pay | Admitting: Family Medicine

## 2018-05-04 ENCOUNTER — Other Ambulatory Visit: Payer: Self-pay | Admitting: Family Medicine

## 2018-05-07 ENCOUNTER — Other Ambulatory Visit: Payer: Self-pay | Admitting: Family Medicine

## 2018-05-11 ENCOUNTER — Ambulatory Visit: Payer: 59 | Admitting: Nurse Practitioner

## 2018-05-11 ENCOUNTER — Other Ambulatory Visit: Payer: Self-pay | Admitting: Nurse Practitioner

## 2018-05-12 ENCOUNTER — Other Ambulatory Visit: Payer: Self-pay | Admitting: Nurse Practitioner

## 2018-05-18 ENCOUNTER — Other Ambulatory Visit: Payer: Self-pay | Admitting: Family Medicine

## 2018-05-18 ENCOUNTER — Ambulatory Visit: Payer: 59 | Admitting: Nurse Practitioner

## 2018-05-27 ENCOUNTER — Ambulatory Visit: Payer: 59 | Admitting: Nurse Practitioner

## 2018-05-27 ENCOUNTER — Encounter: Payer: Self-pay | Admitting: Nurse Practitioner

## 2018-05-27 VITALS — BP 114/80 | Ht 65.5 in | Wt 232.0 lb

## 2018-05-27 DIAGNOSIS — I10 Essential (primary) hypertension: Secondary | ICD-10-CM | POA: Diagnosis not present

## 2018-05-27 DIAGNOSIS — R7301 Impaired fasting glucose: Secondary | ICD-10-CM

## 2018-05-27 DIAGNOSIS — E781 Pure hyperglyceridemia: Secondary | ICD-10-CM

## 2018-05-27 DIAGNOSIS — Z1329 Encounter for screening for other suspected endocrine disorder: Secondary | ICD-10-CM

## 2018-05-27 DIAGNOSIS — K219 Gastro-esophageal reflux disease without esophagitis: Secondary | ICD-10-CM | POA: Diagnosis not present

## 2018-05-27 DIAGNOSIS — E559 Vitamin D deficiency, unspecified: Secondary | ICD-10-CM

## 2018-05-27 DIAGNOSIS — E876 Hypokalemia: Secondary | ICD-10-CM | POA: Diagnosis not present

## 2018-05-27 DIAGNOSIS — Z79899 Other long term (current) drug therapy: Secondary | ICD-10-CM

## 2018-05-27 NOTE — Progress Notes (Signed)
Subjective:  Presents for recheck on HTN. Limited activity since she has been taking care of her mother. Has tried to do better with her diet. Adherent to medications. Takes Protonix on a prn basis. Denies CP/ischemic type pain or SOB. No visual changes. No difficulty speaking or swallowing. No numbness or weakness of the face, arms or legs. No edema.    Objective:   BP 114/80   Ht 5' 5.5" (1.664 m)   Wt 232 lb 0.4 oz (105.2 kg)   BMI 38.02 kg/m  NAD. Alert, oriented. Lungs clear. Heart RRR. Carotids no bruits or thrills. LE: no edema. Abdomen soft, non distended, non tender.   Assessment:   Problem List Items Addressed This Visit      Cardiovascular and Mediastinum   Essential hypertension, benign - Primary   Relevant Orders   Basic Metabolic Panel (BMET)     Digestive   Gastroesophageal reflux disease without esophagitis     Endocrine   Impaired fasting glucose   Relevant Orders   HgB A1c     Other   Hypertriglyceridemia   Relevant Orders   Lipid Profile   Hypokalemia   Relevant Orders   Hepatic function panel   TSH   Morbid obesity (HCC)   Vitamin D deficiency   Relevant Orders   Vitamin D (25 hydroxy)    Other Visit Diagnoses    High risk medication use       Relevant Orders   Hepatic function panel   Screening for thyroid disorder       Relevant Orders   TSH       Plan:  Continue current medications as directed. Labs pending. Encouraged activity, healthy diet and weight loss.  Return in about 6 months (around 11/26/2018) for recheck .

## 2018-05-28 LAB — HEPATIC FUNCTION PANEL
ALBUMIN: 4.3 g/dL (ref 3.5–5.5)
ALK PHOS: 66 IU/L (ref 39–117)
ALT: 17 IU/L (ref 0–32)
AST: 16 IU/L (ref 0–40)
BILIRUBIN, DIRECT: 0.11 mg/dL (ref 0.00–0.40)
Bilirubin Total: 0.4 mg/dL (ref 0.0–1.2)
TOTAL PROTEIN: 7.1 g/dL (ref 6.0–8.5)

## 2018-05-28 LAB — BASIC METABOLIC PANEL
BUN/Creatinine Ratio: 14 (ref 9–23)
BUN: 16 mg/dL (ref 6–24)
CHLORIDE: 103 mmol/L (ref 96–106)
CO2: 23 mmol/L (ref 20–29)
Calcium: 10 mg/dL (ref 8.7–10.2)
Creatinine, Ser: 1.14 mg/dL — ABNORMAL HIGH (ref 0.57–1.00)
GFR calc non Af Amer: 55 mL/min/{1.73_m2} — ABNORMAL LOW (ref >59)
GFR, EST AFRICAN AMERICAN: 63 mL/min/{1.73_m2} (ref >59)
Glucose: 104 mg/dL — ABNORMAL HIGH (ref 65–99)
POTASSIUM: 4.4 mmol/L (ref 3.5–5.2)
SODIUM: 141 mmol/L (ref 134–144)

## 2018-05-28 LAB — LIPID PANEL
Chol/HDL Ratio: 4.6 ratio — ABNORMAL HIGH (ref 0.0–4.4)
Cholesterol, Total: 158 mg/dL (ref 100–199)
HDL: 34 mg/dL — ABNORMAL LOW (ref >39)
LDL Calculated: 89 mg/dL (ref 0–99)
Triglycerides: 177 mg/dL — ABNORMAL HIGH (ref 0–149)
VLDL Cholesterol Cal: 35 mg/dL (ref 5–40)

## 2018-05-28 LAB — VITAMIN D 25 HYDROXY (VIT D DEFICIENCY, FRACTURES): Vit D, 25-Hydroxy: 26.4 ng/mL — ABNORMAL LOW (ref 30.0–100.0)

## 2018-05-28 LAB — TSH: TSH: 1.3 u[IU]/mL (ref 0.450–4.500)

## 2018-05-28 LAB — HEMOGLOBIN A1C
ESTIMATED AVERAGE GLUCOSE: 123 mg/dL
Hgb A1c MFr Bld: 5.9 % — ABNORMAL HIGH (ref 4.8–5.6)

## 2018-05-31 ENCOUNTER — Encounter: Payer: Self-pay | Admitting: Nurse Practitioner

## 2018-06-16 ENCOUNTER — Ambulatory Visit: Payer: 59 | Admitting: Nurse Practitioner

## 2018-08-13 ENCOUNTER — Other Ambulatory Visit: Payer: Self-pay | Admitting: Family Medicine

## 2018-09-14 DIAGNOSIS — Z029 Encounter for administrative examinations, unspecified: Secondary | ICD-10-CM

## 2018-10-14 ENCOUNTER — Other Ambulatory Visit: Payer: Self-pay | Admitting: *Deleted

## 2018-10-14 MED ORDER — LISINOPRIL 10 MG PO TABS: 10.0000 mg | ORAL_TABLET | Freq: Every day | ORAL | 0 refills | Status: DC

## 2018-11-15 ENCOUNTER — Other Ambulatory Visit: Payer: Self-pay

## 2018-11-15 MED ORDER — POTASSIUM CHLORIDE ER 10 MEQ PO TBCR: 10.0000 meq | EXTENDED_RELEASE_TABLET | Freq: Every day | ORAL | 1 refills | Status: DC

## 2018-11-23 ENCOUNTER — Telehealth: Payer: Self-pay | Admitting: Family Medicine

## 2018-11-23 NOTE — Telephone Encounter (Signed)
No will disc with pt then

## 2018-11-23 NOTE — Telephone Encounter (Signed)
Pharmacy sent fax over stating that they spoke with patient about diabetes care and noticed that patient has not filled a statin [facility] at [facility] in the last 180 days. Pharmacy requesting that if patient is to start statin therapy. Please advise. Thank you

## 2018-11-26 ENCOUNTER — Encounter: Payer: Self-pay | Admitting: Family Medicine

## 2018-11-26 ENCOUNTER — Ambulatory Visit: Payer: 59 | Admitting: Family Medicine

## 2018-11-26 VITALS — BP 110/70 | Ht 65.5 in | Wt 235.0 lb

## 2018-11-26 DIAGNOSIS — E119 Type 2 diabetes mellitus without complications: Secondary | ICD-10-CM | POA: Diagnosis not present

## 2018-11-26 DIAGNOSIS — M545 Low back pain, unspecified: Secondary | ICD-10-CM

## 2018-11-26 DIAGNOSIS — G8929 Other chronic pain: Secondary | ICD-10-CM | POA: Diagnosis not present

## 2018-11-26 DIAGNOSIS — I10 Essential (primary) hypertension: Secondary | ICD-10-CM | POA: Diagnosis not present

## 2018-11-26 LAB — POCT URINALYSIS DIPSTICK
Spec Grav, UA: >=1.03 — AB (ref 1.010–1.025)
pH, UA: 5 (ref 5.0–8.0)

## 2018-11-26 LAB — POCT GLYCOSYLATED HEMOGLOBIN (HGB A1C): Hemoglobin A1C: 5.7 % — AB (ref 4.0–5.6)

## 2018-11-26 MED ORDER — LISINOPRIL 10 MG PO TABS: 10.0000 mg | ORAL_TABLET | Freq: Every day | ORAL | 1 refills | Status: DC

## 2018-11-26 MED ORDER — POTASSIUM CHLORIDE ER 10 MEQ PO TBCR: 10.0000 meq | EXTENDED_RELEASE_TABLET | Freq: Every day | ORAL | 1 refills | Status: DC

## 2018-11-26 MED ORDER — HYDROCHLOROTHIAZIDE 25 MG PO TABS: 25.0000 mg | ORAL_TABLET | Freq: Every day | ORAL | 1 refills | Status: DC

## 2018-11-26 MED ORDER — BLOOD GLUCOSE MONITOR KIT: PACK | 1 refills | Status: DC

## 2018-11-26 MED ORDER — NITROFURANTOIN MONOHYD MACRO 100 MG PO CAPS: 100.0000 mg | ORAL_CAPSULE | Freq: Two times a day (BID) | ORAL | 0 refills | Status: DC

## 2018-11-26 MED ORDER — METFORMIN HCL 500 MG PO TABS: ORAL_TABLET | ORAL | 1 refills | Status: DC

## 2018-11-26 NOTE — Patient Instructions (Signed)
Try to do these for fifteen minutes at least three times per week  Back Exercises The following exercises strengthen the muscles that help to support the back. They also help to keep the lower back flexible. Doing these exercises can help to prevent back pain or lessen existing pain. If you have back pain or discomfort, try doing these exercises 2-3 times each day or as told by your health care provider. When the pain goes away, do them once each day, but increase the number of times that you repeat the steps for each exercise (do more repetitions). If you do not have back pain or discomfort, do these exercises once each day or as told by your health care provider. Exercises Single Knee to Chest Repeat these steps 3-5 times for each leg: 1. Lie on your back on a firm bed or the floor with your legs extended. 2. Bring one knee to your chest. Your other leg should stay extended and in contact with the floor. 3. Hold your knee in place by grabbing your knee or thigh. 4. Pull on your knee until you feel a gentle stretch in your lower back. 5. Hold the stretch for 10-30 seconds. 6. Slowly release and straighten your leg. Pelvic Tilt Repeat these steps 5-10 times: 1. Lie on your back on a firm bed or the floor with your legs extended. 2. Bend your knees so they are pointing toward the ceiling and your feet are flat on the floor. 3. Tighten your lower abdominal muscles to press your lower back against the floor. This motion will tilt your pelvis so your tailbone points up toward the ceiling instead of pointing to your feet or the floor. 4. With gentle tension and even breathing, hold this position for 5-10 seconds. Cat-Cow Repeat these steps until your lower back becomes more flexible: 1. Get into a hands-and-knees position on a firm surface. Keep your hands under your shoulders, and keep your knees under your hips. You may place padding under your knees for comfort. 2. Let your head hang down, and  point your tailbone toward the floor so your lower back becomes rounded like the back of a cat. 3. Hold this position for 5 seconds. 4. Slowly lift your head and point your tailbone up toward the ceiling so your back forms a sagging arch like the back of a cow. 5. Hold this position for 5 seconds.  Press-Ups Repeat these steps 5-10 times: 1. Lie on your abdomen (face-down) on the floor. 2. Place your palms near your head, about shoulder-width apart. 3. While you keep your back as relaxed as possible and keep your hips on the floor, slowly straighten your arms to raise the top half of your body and lift your shoulders. Do not use your back muscles to raise your upper torso. You may adjust the placement of your hands to make yourself more comfortable. 4. Hold this position for 5 seconds while you keep your back relaxed. 5. Slowly return to lying flat on the floor.  Bridges Repeat these steps 10 times: 1. Lie on your back on a firm surface. 2. Bend your knees so they are pointing toward the ceiling and your feet are flat on the floor. 3. Tighten your buttocks muscles and lift your buttocks off of the floor until your waist is at almost the same height as your knees. You should feel the muscles working in your buttocks and the back of your thighs. If you do not feel these muscles,  slide your feet 1-2 inches farther away from your buttocks. 4. Hold this position for 3-5 seconds. 5. Slowly lower your hips to the starting position, and allow your buttocks muscles to relax completely. If this exercise is too easy, try doing it with your arms crossed over your chest. Abdominal Crunches Repeat these steps 5-10 times: 1. Lie on your back on a firm bed or the floor with your legs extended. 2. Bend your knees so they are pointing toward the ceiling and your feet are flat on the floor. 3. Cross your arms over your chest. 4. Tip your chin slightly toward your chest without bending your neck. 5. Tighten  your abdominal muscles and slowly raise your trunk (torso) high enough to lift your shoulder blades a tiny bit off of the floor. Avoid raising your torso higher than that, because it can put too much stress on your low back and it does not help to strengthen your abdominal muscles. 6. Slowly return to your starting position. Back Lifts Repeat these steps 5-10 times: 1. Lie on your abdomen (face-down) with your arms at your sides, and rest your forehead on the floor. 2. Tighten the muscles in your legs and your buttocks. 3. Slowly lift your chest off of the floor while you keep your hips pressed to the floor. Keep the back of your head in line with the curve in your back. Your eyes should be looking at the floor. 4. Hold this position for 3-5 seconds. 5. Slowly return to your starting position. Contact a health care provider if:  Your back pain or discomfort gets much worse when you do an exercise.  Your back pain or discomfort does not lessen within 2 hours after you exercise. If you have any of these problems, stop doing these exercises right away. Do not do them again unless your health care provider says that you can. Get help right away if:  You develop sudden, severe back pain. If this happens, stop doing the exercises right away. Do not do them again unless your health care provider says that you can. This information is not intended to replace advice given to you by your health care provider. Make sure you discuss any questions you have with your health care provider. Document Released: 01/01/2005 Document Revised: 03/30/2018 Document Reviewed: 01/18/2015 Elsevier Interactive Patient Education  [employer]2019 [employer] Inc[employer]

## 2018-11-26 NOTE — Progress Notes (Signed)
   Subjective:    Patient ID: Rosalio MacadamiaArnita L Shaff, female    DOB: Jan 29, 1966, 52 y.o.   MRN: [id number] Patient arrives for multiple concerns Diabetes  She presents for her follow-up diabetic visit. She has type 2 diabetes mellitus. Current diabetic treatments: metformin. She is compliant with treatment all of the time. Home blood sugar record trend: around 120. She sees a podiatrist.Eye exam is current (this year in the spring).  Patient actually has prediabetes.  Discussed.   Lower back pain. Started a couple months ago. Pain is worse when sitting.   Low back pain, osometimes right side sometimes left side  Uses low back heating padfs  No majr history of back discomfort  No urinary syntis    Pt has prediabetes. Both parents have diabetes. Working on diet, but not the best   ++++  +  Pt works as a Armed forces operational officerquality leader a [employer]     Results for orders placed or performed in visit on 11/26/18  POCT urinalysis dipstick  Result Value Ref Range   Color, UA     Clarity, UA     Glucose, UA     Bilirubin, UA ++    Ketones, UA     Spec Grav, UA >=1.030 (A) 1.010 - 1.025   Blood, UA     pH, UA 5.0 5.0 - 8.0   Protein, UA     Urobilinogen, UA     Nitrite, UA     Leukocytes, UA Moderate (2+) (A) Negative   Appearance     Odor    POCT glycosylated hemoglobin (Hb A1C)  Result Value Ref Range   Hemoglobin A1C 5.7 (A) 4.0 - 5.6 %   HbA1c POC (<> result, manual entry)     HbA1c, POC (prediabetic range)     HbA1c, POC (controlled diabetic range)      Review of Systems .rs No headache, no major weight loss or weight gain, no chest pain no back pain abdominal pain no change in bowel habits complete ROS otherwise negative     Objective:   Physical Exam  Alert and oriented, vitals reviewed and stable, NAD ENT-TM's and ext canals WNL bilat via otoscopic exam Soft palate, tonsils and post pharynx WNL via oropharyngeal exam Neck-symmetric, no masses; thyroid nonpalpable and  nontender Pulmonary-no tachypnea or accessory muscle use; Clear without wheezes via auscultation Card--no abnrml murmurs, rhythm reg and rate WNL Carotid pulses symmetric, without bruits       Assessment & Plan:  Impression 1 chronic low back pain.  Several months duration.  Worse with certain movements.  Primarily lumbar region.  Rare radiation into the legs.  Able to keep working.  Strong family history of arthritis personal history of bilateral knee arthritis.  Back exercises discussed at length.  PRN medications discussed  uti.  Present today.  Minimal symptomatology.  Will treat rationale discussed  3.  Prediabetes discussed.  Diet discussed weight loss discussed compliance with metformin discussed  4.  Hypertension good control discussed maintain same  Follow-up in 6 months.  Maintain all meds.  Add Macrobid.  Add low back exercises warning signs discussed

## 2019-02-25 ENCOUNTER — Other Ambulatory Visit: Payer: Self-pay | Admitting: Family Medicine

## 2019-02-28 ENCOUNTER — Other Ambulatory Visit: Payer: Self-pay

## 2019-02-28 MED ORDER — ACYCLOVIR 400 MG PO TABS: ORAL_TABLET | ORAL | 5 refills | Status: DC

## 2019-03-22 ENCOUNTER — Other Ambulatory Visit: Payer: Self-pay | Admitting: Family Medicine

## 2019-05-24 ENCOUNTER — Other Ambulatory Visit: Payer: Self-pay

## 2019-05-24 ENCOUNTER — Ambulatory Visit: Payer: 59 | Admitting: Family Medicine

## 2019-05-25 ENCOUNTER — Ambulatory Visit (INDEPENDENT_AMBULATORY_CARE_PROVIDER_SITE_OTHER): Payer: 59 | Admitting: Family Medicine

## 2019-05-25 DIAGNOSIS — E119 Type 2 diabetes mellitus without complications: Secondary | ICD-10-CM | POA: Diagnosis not present

## 2019-05-25 DIAGNOSIS — K219 Gastro-esophageal reflux disease without esophagitis: Secondary | ICD-10-CM

## 2019-05-25 DIAGNOSIS — I10 Essential (primary) hypertension: Secondary | ICD-10-CM | POA: Diagnosis not present

## 2019-05-25 MED ORDER — POTASSIUM CHLORIDE ER 10 MEQ PO TBCR: 10.0000 meq | EXTENDED_RELEASE_TABLET | Freq: Every day | ORAL | 1 refills | Status: DC

## 2019-05-25 MED ORDER — METFORMIN HCL 500 MG PO TABS: ORAL_TABLET | ORAL | 1 refills | Status: DC

## 2019-05-25 MED ORDER — LISINOPRIL 10 MG PO TABS: 10.0000 mg | ORAL_TABLET | Freq: Every day | ORAL | 1 refills | Status: DC

## 2019-05-25 MED ORDER — HYDROCHLOROTHIAZIDE 25 MG PO TABS: 25.0000 mg | ORAL_TABLET | Freq: Every day | ORAL | 1 refills | Status: DC

## 2019-05-25 NOTE — Progress Notes (Signed)
   Subjective:    Patient ID: Sharon Bray, female    DOB: Feb 17, 1966, 53 y.o.   MRN: [id number] Audio plus video Diabetes She presents for her follow-up diabetic visit. She has type 2 diabetes mellitus. Risk factors for coronary artery disease include diabetes mellitus and hypertension. Current diabetic treatment includes oral agent (monotherapy). She is compliant with treatment all of the time. Her weight is stable. She is following a diabetic diet.   Patient states her sugars have been running good 100-110 in am  Virtual Visit via Video Note  I connected with Sharon Bray on 05/25/19 at  3:00 PM EDT by a video enabled telemedicine application and verified that I am speaking with the correct person using two identifiers.  Location: Patient: home Provider: office   I discussed the limitations of evaluation and management by telemedicine and the availability of in person appointments. The patient expressed understanding and agreed to proceed.  History of Present Illness:    Observations/Objective:   Assessment and Plan:   Follow Up Instructions:    I discussed the assessment and treatment plan with the patient. The patient was provided an opportunity to ask questions and all were answered. The patient agreed with the plan and demonstrated an understanding of the instructions.   The patient was advised to call back or seek an in-person evaluation if the symptoms worsen or if the condition fails to improve as anticipated.  I provided 20 minutes of non-face-to-face time during this encounter.   Blood pressure medicine and blood pressure levels reviewed today with patient. Compliant with blood pressure medicine. States does not miss a dose. No obvious side effects. Blood pressure generally good when checked elsewhere. Watching salt intake.   Patient claims compliance with diabetes medication. No obvious side effects. Reports no substantial low sugar spells. Most numbers are  generally in good range when checked fasting. Generally does not miss a dose of medication. Watching diabetic diet closely   Reflux clinically stable.  Compliant with medications.   Review of Systems No headache, no major weight loss or weight gain, no chest pain no back pain abdominal pain no change in bowel habits complete ROS otherwise negative     Objective:   Physical Exam   Virtual     Assessment & Plan:  Impression type 2 diabetes.  Apparent good control discussed to maintain same meds  2.  Hypertension.  Compliance discussed.  To maintain same meds  Diet exercise discussed and encouraged.  Follow-up in 6 months

## 2019-06-28 ENCOUNTER — Other Ambulatory Visit: Payer: Self-pay | Admitting: Family Medicine

## 2019-08-08 ENCOUNTER — Other Ambulatory Visit: Payer: Self-pay | Admitting: Family Medicine

## 2019-08-14 ENCOUNTER — Other Ambulatory Visit: Payer: Self-pay | Admitting: Family Medicine

## 2019-08-18 ENCOUNTER — Other Ambulatory Visit: Payer: Self-pay | Admitting: Family Medicine

## 2019-08-22 ENCOUNTER — Other Ambulatory Visit: Payer: Self-pay | Admitting: Family Medicine

## 2019-08-29 ENCOUNTER — Telehealth: Payer: Self-pay | Admitting: Family Medicine

## 2019-08-29 MED ORDER — PANTOPRAZOLE SODIUM 40 MG PO TBEC: 40.0000 mg | DELAYED_RELEASE_TABLET | Freq: Every day | ORAL | 5 refills | Status: DC

## 2019-08-29 NOTE — Telephone Encounter (Signed)
Protonix sent in to pharmacy with refills. Pt contacted and verbalized understanding

## 2019-08-29 NOTE — Telephone Encounter (Signed)
Patient said she called the pharmacy and they said they never received the rx for pantoprazole (PROTONIX) 40 MG tablet even though it says receipted by pharmacy 08-18-19.  She would like this resent in and would like some refills since she had a virtual visit in June and he told her to come back in 6 mos.

## 2019-10-12 ENCOUNTER — Telehealth: Payer: Self-pay | Admitting: Family Medicine

## 2019-10-12 DIAGNOSIS — Z79899 Other long term (current) drug therapy: Secondary | ICD-10-CM

## 2019-10-12 DIAGNOSIS — E781 Pure hyperglyceridemia: Secondary | ICD-10-CM

## 2019-10-12 DIAGNOSIS — I10 Essential (primary) hypertension: Secondary | ICD-10-CM

## 2019-10-12 DIAGNOSIS — R7301 Impaired fasting glucose: Secondary | ICD-10-CM

## 2019-10-12 NOTE — Telephone Encounter (Signed)
Last labs completed on 05/27/18 Vit D, TSH, A1C, Lipid, Hepatic, Bmet. Please advise. Thank you

## 2019-10-12 NOTE — Telephone Encounter (Signed)
Patient is requesting labs for her 6 month follow up  She states none was ordered in June so she would like to get them at her next appointment which is 12/18

## 2019-10-14 NOTE — Telephone Encounter (Signed)
Lab orders placed and pt is aware 

## 2019-10-14 NOTE — Telephone Encounter (Signed)
CMP, Lipid, A1C. Thanks.

## 2019-10-23 ENCOUNTER — Other Ambulatory Visit: Payer: Self-pay | Admitting: Family Medicine

## 2019-10-29 LAB — LIPID PANEL
Chol/HDL Ratio: 4.1 ratio (ref 0.0–4.4)
Cholesterol, Total: 146 mg/dL (ref 100–199)
HDL: 36 mg/dL — ABNORMAL LOW (ref >39)
LDL Chol Calc (NIH): 88 mg/dL (ref 0–99)
Triglycerides: 124 mg/dL (ref 0–149)
VLDL Cholesterol Cal: 22 mg/dL (ref 5–40)

## 2019-10-29 LAB — HEMOGLOBIN A1C
Est. average glucose Bld gHb Est-mCnc: 120 mg/dL
Hgb A1c MFr Bld: 5.8 % — ABNORMAL HIGH (ref 4.8–5.6)

## 2019-11-15 ENCOUNTER — Other Ambulatory Visit: Payer: Self-pay | Admitting: Family Medicine

## 2019-11-25 ENCOUNTER — Other Ambulatory Visit: Payer: Self-pay

## 2019-11-25 ENCOUNTER — Encounter: Payer: Self-pay | Admitting: Nurse Practitioner

## 2019-11-25 ENCOUNTER — Ambulatory Visit (INDEPENDENT_AMBULATORY_CARE_PROVIDER_SITE_OTHER): Payer: 59 | Admitting: Nurse Practitioner

## 2019-11-25 DIAGNOSIS — E876 Hypokalemia: Secondary | ICD-10-CM

## 2019-11-25 DIAGNOSIS — R7301 Impaired fasting glucose: Secondary | ICD-10-CM

## 2019-11-25 DIAGNOSIS — E559 Vitamin D deficiency, unspecified: Secondary | ICD-10-CM

## 2019-11-25 DIAGNOSIS — I10 Essential (primary) hypertension: Secondary | ICD-10-CM

## 2019-11-25 DIAGNOSIS — E781 Pure hyperglyceridemia: Secondary | ICD-10-CM

## 2019-11-25 MED ORDER — METFORMIN HCL 500 MG PO TABS: ORAL_TABLET | ORAL | 0 refills | Status: DC

## 2019-11-25 NOTE — Progress Notes (Signed)
Phone visit Subjective:    Patient ID: Sharon Bray, female    DOB: 10/12/1966, 53 y.o.   MRN: [id number]  Hypertension This is a chronic problem. The current episode started more than 1 year ago. Risk factors for coronary artery disease include post-menopausal state. Treatments tried: hctz, lisinopril. There are no compliance problems.     Discuss recent labs  Virtual Visit via Video Note  I connected with Sharon Bray on 11/25/19 at 10:20 AM EST by a video enabled telemedicine application and verified that I am speaking with the correct person using two identifiers.  Location: Patient: home Provider: office   I discussed the limitations of evaluation and management by telemedicine and the availability of in person appointments. The patient expressed understanding and agreed to proceed.  History of Present Illness: Presents for recheck on her chronic health issues.  Adherent to medication regimen.  Limited activity due to Covid.  Diet is much improved, has been limiting her snacks and has cut out her sugary beverages, only drinks 1 occasionally versus every day.  Also limited amounts of bread.  BP at home today 134/93.  No chest pain/ischemic type pain shortness of breath or edema.  No visual changes, no difficulty speaking or swallowing, no numbness or weakness of the face arms or legs.  Plans an eye exam early next year.  Regular dental care every 3 months.  Patient lost her mom back in October.  States she is doing fine in her grief process, defers any intervention at this point.   Observations/Objective: Attempted a video visit but sound did not work.  Switch to a phone visit. Today's visit was via telephone Physical exam was not possible for this visit Results for orders placed or performed in visit on 10/12/19  Lipid Profile  Result Value Ref Range   Cholesterol, Total 146 100 - 199 mg/dL   Triglycerides 191 0 - 149 mg/dL   HDL 36 (L) >47 mg/dL   VLDL Cholesterol Cal 22 5 -  40 mg/dL   LDL Chol Calc (NIH) 88 0 - 99 mg/dL   Chol/HDL Ratio 4.1 0.0 - 4.4 ratio  Hemoglobin A1c  Result Value Ref Range   Hgb A1c MFr Bld 5.8 (H) 4.8 - 5.6 %   Est. average glucose Bld gHb Est-mCnc 120 mg/dL   Reviewed results with patient.    Assessment and Plan: Problem List Items Addressed This Visit      Cardiovascular and Mediastinum   Essential hypertension, benign - Primary   Relevant Orders   Comprehensive metabolic panel     Endocrine   Impaired fasting glucose     Other   Hypertriglyceridemia   Hypokalemia   Relevant Orders   Comprehensive metabolic panel   Vitamin D deficiency   Relevant Orders   Vitamin D 25 hydroxy     Meds ordered this encounter  Medications  . metFORMIN (GLUCOPHAGE) 500 MG tablet    Sig: TAKE 1 TABLET BY MOUTH TWICE A DAY WITH MEALS    Dispense:  180 tablet    Refill:  0    Order Specific Question:   Supervising Provider    Answer:   Lilyan Punt A [[id number]]     Follow Up Instructions: Continue weight loss efforts.  Recommend increasing activity as tolerated.  The lab did not run her CMP with her last labs.  CMP and vitamin D ordered today, patient will get these done in the near future.  Gets regular preventive health physicals  with gynecology. Return in about 6 months (around 05/25/2020) for Recheck.    I discussed the assessment and treatment plan with the patient. The patient was provided an opportunity to ask questions and all were answered. The patient agreed with the plan and demonstrated an understanding of the instructions.   The patient was advised to call back or seek an in-person evaluation if the symptoms worsen or if the condition fails to improve as anticipated.  I provided 15 minutes of non-face-to-face time during this encounter.     Review of Systems     Objective:   Physical Exam        Assessment & Plan:

## 2019-11-30 LAB — COMPREHENSIVE METABOLIC PANEL
ALT: 18 IU/L (ref 0–32)
AST: 15 IU/L (ref 0–40)
Albumin/Globulin Ratio: 1.6 (ref 1.2–2.2)
Albumin: 4.5 g/dL (ref 3.8–4.9)
Alkaline Phosphatase: 71 IU/L (ref 39–117)
BUN/Creatinine Ratio: 19 (ref 9–23)
BUN: 21 mg/dL (ref 6–24)
Bilirubin Total: 0.4 mg/dL (ref 0.0–1.2)
CO2: 25 mmol/L (ref 20–29)
Calcium: 9.9 mg/dL (ref 8.7–10.2)
Chloride: 102 mmol/L (ref 96–106)
Creatinine, Ser: 1.12 mg/dL — ABNORMAL HIGH (ref 0.57–1.00)
GFR calc Af Amer: 64 mL/min/{1.73_m2} (ref >59)
GFR calc non Af Amer: 55 mL/min/{1.73_m2} — ABNORMAL LOW (ref >59)
Globulin, Total: 2.8 g/dL (ref 1.5–4.5)
Glucose: 102 mg/dL — ABNORMAL HIGH (ref 65–99)
Potassium: 4.1 mmol/L (ref 3.5–5.2)
Sodium: 142 mmol/L (ref 134–144)
Total Protein: 7.3 g/dL (ref 6.0–8.5)

## 2019-11-30 LAB — VITAMIN D 25 HYDROXY (VIT D DEFICIENCY, FRACTURES): Vit D, 25-Hydroxy: 23.7 ng/mL — ABNORMAL LOW (ref 30.0–100.0)

## 2019-12-14 ENCOUNTER — Other Ambulatory Visit: Payer: Self-pay | Admitting: Family Medicine

## 2019-12-14 ENCOUNTER — Ambulatory Visit: Payer: Self-pay | Admitting: Podiatry

## 2019-12-18 ENCOUNTER — Other Ambulatory Visit: Payer: Self-pay | Admitting: Family Medicine

## 2020-01-02 ENCOUNTER — Ambulatory Visit: Payer: 59 | Admitting: Podiatry

## 2020-01-02 ENCOUNTER — Other Ambulatory Visit: Payer: Self-pay

## 2020-01-02 DIAGNOSIS — M79674 Pain in right toe(s): Secondary | ICD-10-CM

## 2020-01-02 DIAGNOSIS — B351 Tinea unguium: Secondary | ICD-10-CM

## 2020-01-02 DIAGNOSIS — L603 Nail dystrophy: Secondary | ICD-10-CM

## 2020-01-02 DIAGNOSIS — M79675 Pain in left toe(s): Secondary | ICD-10-CM

## 2020-01-04 NOTE — Progress Notes (Signed)
   SUBJECTIVE Patient presents to office today complaining of elongated, thickened nails that cause pain while ambulating in shoes. She is unable to trim her own nails. Patient is here for further evaluation and treatment.  Past Medical History:  Diagnosis Date  . Allergy   . Arthritis   . Impaired fasting glucose   . Sebaceous cyst of breast 2012   left breast    OBJECTIVE General Patient is awake, alert, and oriented x 3 and in no acute distress. Derm Skin is dry and supple bilateral. Negative open lesions or macerations. Remaining integument unremarkable. Nails are tender, long, thickened and dystrophic with subungual debris, consistent with onychomycosis, 1-5 bilateral. No signs of infection noted. Vasc  DP and PT pedal pulses palpable bilaterally. Temperature gradient within normal limits.  Neuro Epicritic and protective threshold sensation grossly intact bilaterally.  Musculoskeletal Exam No symptomatic pedal deformities noted bilateral. Muscular strength within normal limits.  ASSESSMENT 1. Onychodystrophic nails 1-5 bilateral with hyperkeratosis of nails.  2. Onychomycosis of nail due to dermatophyte bilateral 3. Pain in foot bilateral  PLAN OF CARE 1. Patient evaluated today.  2. Instructed to maintain good pedal hygiene and foot care.  3. Mechanical debridement of nails 1-5 bilaterally performed using a nail nipper. Filed with dremel without incident.  4. Return to clinic as needed.    Felecia Shelling, DPM [facility]  Dr. Felecia Shelling, DPM    [address]                                        [place], [place] [address]                Office [phone]  Fax [phone]

## 2020-01-11 ENCOUNTER — Encounter: Payer: Self-pay | Admitting: Family Medicine

## 2020-01-17 ENCOUNTER — Other Ambulatory Visit: Payer: Self-pay | Admitting: Family Medicine

## 2020-01-20 ENCOUNTER — Other Ambulatory Visit: Payer: Self-pay | Admitting: Family Medicine

## 2020-01-26 ENCOUNTER — Ambulatory Visit: Payer: 59

## 2020-01-30 ENCOUNTER — Ambulatory Visit: Payer: 59 | Attending: Family

## 2020-01-30 DIAGNOSIS — Z23 Encounter for immunization: Secondary | ICD-10-CM | POA: Insufficient documentation

## 2020-01-30 NOTE — Progress Notes (Signed)
   Covid-19 Vaccination Clinic  Name:  Sharon Bray    MRN: [id number] DOB: 1965-12-12  01/30/2020  [name] was observed post Covid-19 immunization for 15 minutes without incidence. She was provided with Vaccine Information Sheet and instruction to access the V-Safe system.   [name] was instructed to call 911 with any severe reactions post vaccine: Marland Kitchen Difficulty breathing  . Swelling of your face and throat  . A fast heartbeat  . A bad rash all over your body  . Dizziness and weakness    Immunizations Administered    Name Date Dose VIS Date Route   Moderna COVID-19 Vaccine 01/30/2020  9:46 AM 0.5 mL 11/08/2019 Intramuscular   Manufacturer: Moderna   Lot: [id number]   NDC: 41443-601-65

## 2020-02-18 ENCOUNTER — Other Ambulatory Visit: Payer: Self-pay | Admitting: Nurse Practitioner

## 2020-02-28 ENCOUNTER — Ambulatory Visit: Payer: 59 | Attending: Family

## 2020-02-28 ENCOUNTER — Ambulatory Visit: Payer: 59

## 2020-02-28 DIAGNOSIS — Z23 Encounter for immunization: Secondary | ICD-10-CM

## 2020-02-28 NOTE — Progress Notes (Signed)
   Covid-19 Vaccination Clinic  Name:  Sharon Bray    MRN: [id number] DOB: 1966-02-12  02/28/2020  [name] was observed post Covid-19 immunization for 15 minutes without incident. She was provided with Vaccine Information Sheet and instruction to access the V-Safe system.   [name] was instructed to call 911 with any severe reactions post vaccine: Marland Kitchen Difficulty breathing  . Swelling of face and throat  . A fast heartbeat  . A bad rash all over body  . Dizziness and weakness   Immunizations Administered    Name Date Dose VIS Date Route   Moderna COVID-19 Vaccine 02/28/2020  4:22 PM 0.5 mL 11/08/2019 Intramuscular   Manufacturer: Moderna   Lot: [id number]   NDC: 93716-967-89

## 2020-04-12 ENCOUNTER — Other Ambulatory Visit: Payer: Self-pay | Admitting: Family Medicine

## 2020-05-14 ENCOUNTER — Other Ambulatory Visit: Payer: Self-pay | Admitting: Family Medicine

## 2020-05-14 NOTE — Telephone Encounter (Signed)
Last med checkup 11/25/19. Does have appt this month with caroylyn

## 2020-05-18 ENCOUNTER — Encounter: Payer: Self-pay | Admitting: Nurse Practitioner

## 2020-05-18 ENCOUNTER — Ambulatory Visit: Payer: 59 | Admitting: Nurse Practitioner

## 2020-05-18 ENCOUNTER — Other Ambulatory Visit: Payer: Self-pay

## 2020-05-18 VITALS — BP 112/78 | Temp 97.9°F | Wt 231.2 lb

## 2020-05-18 DIAGNOSIS — I10 Essential (primary) hypertension: Secondary | ICD-10-CM | POA: Diagnosis not present

## 2020-05-18 NOTE — Patient Instructions (Signed)
Soy Flax seed Black Cohosh  Check glucose levels at night when having hot flashes

## 2020-05-18 NOTE — Progress Notes (Signed)
   Subjective:    Patient ID: Sharon Bray, female    DOB: Apr 20, 1966, 54 y.o.   MRN: [id number]  Hypertension This is a chronic problem. Treatments tried: lisinopril, Hctz. There are no compliance problems (diet is okay, unchanged. ).    Patient has complaints of fatigue for several months   Review of Systems     Objective:   Physical Exam        Assessment & Plan:

## 2020-05-18 NOTE — Progress Notes (Signed)
   Subjective:    Patient ID: Sharon Bray, female    DOB: 03-Jun-1966, 54 y.o.   MRN: [id number]  HPI Presents for routine follow up related to dx of hypertension. States she is currently not following diet or exercise regimen and has complaints of night sweats effecting her sleep. Attempted Benadryl and Melatonin with ineffective results. Note that she is not monitoring her blood pressure at home. Gets physicals through GYN.    Review of Systems  Constitutional: Negative for activity change, appetite change, fatigue and fever.  Respiratory: Negative for cough, chest tightness, shortness of breath and wheezing.   Cardiovascular: Negative for chest pain, palpitations and leg swelling.       Objective:   Physical Exam Constitutional:      Appearance: Normal appearance. She is well-groomed.  Neck:     Thyroid: No thyroid mass, thyromegaly or thyroid tenderness.  Cardiovascular:     Rate and Rhythm: Normal rate and regular rhythm.     Heart sounds: Normal heart sounds, S1 normal and S2 normal.  Pulmonary:     Effort: Pulmonary effort is normal.     Breath sounds: Normal breath sounds and air entry. No stridor, decreased air movement or transmitted upper airway sounds.  Lymphadenopathy:     Cervical:     Right cervical: No superficial, deep or posterior cervical adenopathy.    Left cervical: No superficial, deep or posterior cervical adenopathy.    Diabetic Foot Exam - Simple   Simple Foot Form Diabetic Foot exam was performed with the following findings: Yes 05/18/2020  3:29 PM  Visual Inspection No deformities, no ulcerations, no other skin breakdown bilaterally: Yes Sensation Testing Intact to touch and monofilament testing bilaterally: Yes Pulse Check Comments           Assessment & Plan:   Problem List Items Addressed This Visit      Cardiovascular and Mediastinum   Essential hypertension, benign - Primary      Encouraged diet and exercise including limiting  sodium intake Advised to monitor glucose levels at hs when night sweats occur Educated on OTC supplements for night sweats including soy, flax seed and black cohosh Follow up in six months with yearly labs unless earlier appointment is required.

## 2020-05-19 ENCOUNTER — Encounter: Payer: Self-pay | Admitting: Nurse Practitioner

## 2020-06-07 ENCOUNTER — Other Ambulatory Visit: Payer: Self-pay | Admitting: Family Medicine

## 2020-06-07 NOTE — Telephone Encounter (Signed)
Pt needs labs and appt in the next month. Thx. Dr. Ladona Ridgel.

## 2020-06-09 ENCOUNTER — Other Ambulatory Visit: Payer: Self-pay | Admitting: Family Medicine

## 2020-06-12 NOTE — Telephone Encounter (Signed)
Last check up 05/18/20. Last labs 11/2019 lipid, cmp, vit d

## 2020-07-06 ENCOUNTER — Telehealth: Payer: Self-pay | Admitting: Family Medicine

## 2020-07-06 ENCOUNTER — Other Ambulatory Visit: Payer: Self-pay | Admitting: Nurse Practitioner

## 2020-07-06 MED ORDER — POTASSIUM CHLORIDE ER 10 MEQ PO TBCR: 10.0000 meq | EXTENDED_RELEASE_TABLET | Freq: Every day | ORAL | 1 refills | Status: DC

## 2020-07-06 NOTE — Telephone Encounter (Signed)
[facility] requesting refill on Potassium 10 meQ tablet . Take one tablet by mouth every day. Pt last seen 05/18/20 for HTN. Please advise. Thank you

## 2020-07-06 NOTE — Telephone Encounter (Signed)
Done. Rhina Brackett to [facility] in [place]

## 2020-07-07 ENCOUNTER — Other Ambulatory Visit: Payer: Self-pay | Admitting: Family Medicine

## 2020-07-27 ENCOUNTER — Ambulatory Visit: Payer: 59 | Admitting: Family Medicine

## 2020-07-27 ENCOUNTER — Other Ambulatory Visit: Payer: Self-pay

## 2020-07-27 ENCOUNTER — Encounter: Payer: Self-pay | Admitting: Family Medicine

## 2020-07-27 VITALS — BP 118/80 | HR 92 | Temp 97.7°F | Ht 65.5 in | Wt 230.0 lb

## 2020-07-27 DIAGNOSIS — M62830 Muscle spasm of back: Secondary | ICD-10-CM

## 2020-07-27 DIAGNOSIS — M545 Low back pain, unspecified: Secondary | ICD-10-CM

## 2020-07-27 DIAGNOSIS — M25551 Pain in right hip: Secondary | ICD-10-CM

## 2020-07-27 MED ORDER — TIZANIDINE HCL 2 MG PO CAPS: 2.0000 mg | ORAL_CAPSULE | Freq: Three times a day (TID) | ORAL | 0 refills | Status: DC

## 2020-07-27 NOTE — Progress Notes (Signed)
Patient ID: Sharon Bray, female    DOB: [birth year], 54 y.o.   MRN: [id number]   Chief Complaint  Patient presents with  . Back Pain   Subjective:    HPI  CC- lower back pain since June. No known injury. Pain is worse when bending over, twisting or walking for a long time. Tried ibuprofen, tylenol and voltaren.  Feeling like it worsening.  5 days ago moving some furniture.  Not too heavy with lifting, but more pushing furniture around. Lower back bilateral pain  Coming to the front of hip. meds- diclofenac and ibuprofen prn.  Not taking daily. Eases the pain. Last night took 2 tylenol and feels better today. No pain or burning with urination. Using occ heat/ice, topical .  Medical History Sharon Bray has a past medical history of Allergy, Arthritis, Impaired fasting glucose, and Sebaceous cyst of breast (2012).   Outpatient Encounter Medications as of 07/27/2020  Medication Sig  . acyclovir (ZOVIRAX) 400 MG tablet TAKE 1 TABLET BY MOUTH 3 TIMES A DAY FOR 5 DAYS  . Blood Glucose Monitoring Suppl (ONE TOUCH ULTRA 2) w/Device KIT USE AS DIRECTED  . diclofenac (VOLTAREN) 75 MG EC tablet Take 1 tablet (75 mg total) by mouth 2 (two) times daily.  . hydrochlorothiazide (HYDRODIURIL) 25 MG tablet TAKE 1 TABLET BY MOUTH EVERY DAY  . lisinopril (ZESTRIL) 10 MG tablet TAKE 1 TABLET BY MOUTH EVERY DAY  . metFORMIN (GLUCOPHAGE) 500 MG tablet TAKE 1 TABLET BY MOUTH TWICE A DAY WITH MEALS  . OneTouch Delica Lancets 53Z MISC USE AS DIRECTED DAILY  . ONETOUCH ULTRA test strip USE AS DIRECTED  . pantoprazole (PROTONIX) 40 MG tablet TAKE 1 TABLET BY MOUTH EVERY DAY  . potassium chloride (KLOR-CON) 10 MEQ tablet Take 1 tablet (10 mEq total) by mouth daily.  . tizanidine (ZANAFLEX) 2 MG capsule Take 1 capsule (2 mg total) by mouth 3 (three) times daily. For muscle spasm.   Facility-Administered Encounter Medications as of 07/27/2020  Medication  . betamethasone acetate-betamethasone sodium phosphate  (CELESTONE) injection 3 mg  . betamethasone acetate-betamethasone sodium phosphate (CELESTONE) injection 3 mg     Review of Systems  Constitutional: Negative for chills and fever.  HENT: Negative for congestion, rhinorrhea and sore throat.   Respiratory: Negative for cough, shortness of breath and wheezing.   Cardiovascular: Negative for chest pain and leg swelling.  Gastrointestinal: Negative for abdominal pain, diarrhea, nausea and vomiting.  Genitourinary: Negative for dysuria and frequency.  Musculoskeletal: Positive for back pain. Negative for arthralgias.  Skin: Negative for rash.  Neurological: Negative for dizziness, weakness and headaches.     Vitals BP 118/80   Pulse 92   Temp 97.7 F (36.5 C)   Ht 5' 5.5" (1.664 m)   Wt 230 lb (104.3 kg)   SpO2 98%   BMI 37.69 kg/m   Objective:   Physical Exam Vitals and nursing note reviewed.  Constitutional:      Appearance: Normal appearance.  HENT:     Head: Normocephalic and atraumatic.     Nose: Nose normal.     Mouth/Throat:     Mouth: Mucous membranes are moist.     Pharynx: Oropharynx is clear.  Eyes:     Extraocular Movements: Extraocular movements intact.     Conjunctiva/sclera: Conjunctivae normal.     Pupils: Pupils are equal, round, and reactive to light.  Cardiovascular:     Rate and Rhythm: Normal rate and regular rhythm.  Pulses: Normal pulses.     Heart sounds: Normal heart sounds.  Pulmonary:     Effort: Pulmonary effort is normal.     Breath sounds: Normal breath sounds. No wheezing, rhonchi or rales.  Musculoskeletal:        General: Normal range of motion.     Right lower leg: No edema.     Left lower leg: No edema.     Comments: +bilateral lumbar paraspinal pain. +slr on rt and neg on left.  No rash, normal skin inspection. Normal rom of back. +dec rom of rt hip with internal rotation.  Skin:    General: Skin is warm and dry.     Findings: No lesion or rash.  Neurological:      General: No focal deficit present.     Mental Status: She is alert and oriented to person, place, and time.  Psychiatric:        Mood and Affect: Mood normal.        Behavior: Behavior normal.      Assessment and Plan   1. Hip pain, acute, right - DG Hip Unilat W OR W/O Pelvis 2-3 Views Right; Future - tizanidine (ZANAFLEX) 2 MG capsule; Take 1 capsule (2 mg total) by mouth 3 (three) times daily. For muscle spasm.  Dispense: 20 capsule; Refill: 0  2. Lumbar pain - DG Lumbar Spine Complete; Future - tizanidine (ZANAFLEX) 2 MG capsule; Take 1 capsule (2 mg total) by mouth 3 (three) times daily. For muscle spasm.  Dispense: 20 capsule; Refill: 0  3. Lumbar paraspinal muscle spasm - tizanidine (ZANAFLEX) 2 MG capsule; Take 1 capsule (2 mg total) by mouth 3 (three) times daily. For muscle spasm.  Dispense: 20 capsule; Refill: 0   Pt to take diclofenac bid she has at home and add zanaflex as needed. Heat/ice 3x per day.  Handout given on exercises and info on back pain. xrays ordered pt to get next week.  F/u prn.

## 2020-07-27 NOTE — Patient Instructions (Signed)
Acute Back Pain, Adult Acute back pain is sudden and usually short-lived. It is often caused by an injury to the muscles and tissues in the back. The injury may result from:  A muscle or ligament getting overstretched or torn (strained). Ligaments are tissues that connect bones to each other. Lifting something improperly can cause a back strain.  Wear and tear (degeneration) of the spinal disks. Spinal disks are circular tissue that provides cushioning between the bones of the spine (vertebrae).  Twisting motions, such as while playing sports or doing yard work.  A hit to the back.  Arthritis. You may have a physical exam, lab tests, and imaging tests to find the cause of your pain. Acute back pain usually goes away with rest and home care. Follow these instructions at home: Managing pain, stiffness, and swelling  Take over-the-counter and prescription medicines only as told by your health care provider.  Your health care provider may recommend applying ice during the first 24-48 hours after your pain starts. To do this: ? Put ice in a plastic bag. ? Place a towel between your skin and the bag. ? Leave the ice on for 20 minutes, 2-3 times a day.  If directed, apply heat to the affected area as often as told by your health care provider. Use the heat source that your health care provider recommends, such as a moist heat pack or a heating pad. ? Place a towel between your skin and the heat source. ? Leave the heat on for 20-30 minutes. ? Remove the heat if your skin turns bright red. This is especially important if you are unable to feel pain, heat, or cold. You have a greater risk of getting burned. Activity   Do not stay in bed. Staying in bed for more than 1-2 days can delay your recovery.  Sit up and stand up straight. Avoid leaning forward when you sit, or hunching over when you stand. ? If you work at a desk, sit close to it so you do not need to lean over. Keep your chin tucked  in. Keep your neck drawn back, and keep your elbows bent at a right angle. Your arms should look like the letter "L." ? Sit high and close to the steering wheel when you drive. Add lower back (lumbar) support to your car seat, if needed.  Take short walks on even surfaces as soon as you are able. Try to increase the length of time you walk each day.  Do not sit, drive, or stand in one place for more than 30 minutes at a time. Sitting or standing for long periods of time can put stress on your back.  Do not drive or use heavy machinery while taking prescription pain medicine.  Use proper lifting techniques. When you bend and lift, use positions that put less stress on your back: ? Bend your knees. ? Keep the load close to your body. ? Avoid twisting.  Exercise regularly as told by your health care provider. Exercising helps your back heal faster and helps prevent back injuries by keeping muscles strong and flexible.  Work with a physical therapist to make a safe exercise program, as recommended by your health care provider. Do any exercises as told by your physical therapist. Lifestyle  Maintain a healthy weight. Extra weight puts stress on your back and makes it difficult to have good posture.  Avoid activities or situations that make you feel anxious or stressed. Stress and anxiety increase muscle   tension and can make back pain worse. Learn ways to manage anxiety and stress, such as through exercise. General instructions  Sleep on a firm mattress in a comfortable position. Try lying on your side with your knees slightly bent. If you lie on your back, put a pillow under your knees.  Follow your treatment plan as told by your health care provider. This may include: ? Cognitive or behavioral therapy. ? Acupuncture or massage therapy. ? Meditation or yoga. Contact a health care provider if:  You have pain that is not relieved with rest or medicine.  You have increasing pain going down  into your legs or buttocks.  Your pain does not improve after 2 weeks.  You have pain at night.  You lose weight without trying.  You have a fever or chills. Get help right away if:  You develop new bowel or bladder control problems.  You have unusual weakness or numbness in your arms or legs.  You develop nausea or vomiting.  You develop abdominal pain.  You feel faint. Summary  Acute back pain is sudden and usually short-lived.  Use proper lifting techniques. When you bend and lift, use positions that put less stress on your back.  Take over-the-counter and prescription medicines and apply heat or ice as directed by your health care provider. This information is not intended to replace advice given to you by your health care provider. Make sure you discuss any questions you have with your health care provider. Document Revised: 03/15/2019 Document Reviewed: 07/08/2017 Elsevier Patient Education  2020 [employer]  Back Exercises These exercises help to make your trunk and back strong. They also help to keep the lower back flexible. Doing these exercises can help to prevent back pain or lessen existing pain.  If you have back pain, try to do these exercises 2-3 times each day or as told by your doctor.  As you get better, do the exercises once each day. Repeat the exercises more often as told by your doctor.  To stop back pain from coming back, do the exercises once each day, or as told by your doctor. Exercises Single knee to chest Do these steps 3-5 times in a row for each leg: 1. Lie on your back on a firm bed or the floor with your legs stretched out. 2. Bring one knee to your chest. 3. Grab your knee or thigh with both hands and hold them it in place. 4. Pull on your knee until you feel a gentle stretch in your lower back or buttocks. 5. Keep doing the stretch for 10-30 seconds. 6. Slowly let go of your leg and straighten it. Pelvic tilt Do these steps 5-10  times in a row: 1. Lie on your back on a firm bed or the floor with your legs stretched out. 2. Bend your knees so they point up to the ceiling. Your feet should be flat on the floor. 3. Tighten your lower belly (abdomen) muscles to press your lower back against the floor. This will make your tailbone point up to the ceiling instead of pointing down to your feet or the floor. 4. Stay in this position for 5-10 seconds while you gently tighten your muscles and breathe evenly. Cat-cow Do these steps until your lower back bends more easily: 1. Get on your hands and knees on a firm surface. Keep your hands under your shoulders, and keep your knees under your hips. You may put padding under your knees. 2. Let   your head hang down toward your chest. Tighten (contract) the muscles in your belly. Point your tailbone toward the floor so your lower back becomes rounded like the back of a cat. 3. Stay in this position for 5 seconds. 4. Slowly lift your head. Let the muscles of your belly relax. Point your tailbone up toward the ceiling so your back forms a sagging arch like the back of a cow. 5. Stay in this position for 5 seconds.  Press-ups Do these steps 5-10 times in a row: 1. Lie on your belly (face-down) on the floor. 2. Place your hands near your head, about shoulder-width apart. 3. While you keep your back relaxed and keep your hips on the floor, slowly straighten your arms to raise the top half of your body and lift your shoulders. Do not use your back muscles. You may change where you place your hands in order to make yourself more comfortable. 4. Stay in this position for 5 seconds. 5. Slowly return to lying flat on the floor.  Bridges Do these steps 10 times in a row: 1. Lie on your back on a firm surface. 2. Bend your knees so they point up to the ceiling. Your feet should be flat on the floor. Your arms should be flat at your sides, next to your body. 3. Tighten your butt muscles and lift  your butt off the floor until your waist is almost as high as your knees. If you do not feel the muscles working in your butt and the back of your thighs, slide your feet 1-2 inches farther away from your butt. 4. Stay in this position for 3-5 seconds. 5. Slowly lower your butt to the floor, and let your butt muscles relax. If this exercise is too easy, try doing it with your arms crossed over your chest. Belly crunches Do these steps 5-10 times in a row: 1. Lie on your back on a firm bed or the floor with your legs stretched out. 2. Bend your knees so they point up to the ceiling. Your feet should be flat on the floor. 3. Cross your arms over your chest. 4. Tip your chin a little bit toward your chest but do not bend your neck. 5. Tighten your belly muscles and slowly raise your chest just enough to lift your shoulder blades a tiny bit off of the floor. Avoid raising your body higher than that, because it can put too much stress on your low back. 6. Slowly lower your chest and your head to the floor. Back lifts Do these steps 5-10 times in a row: 1. Lie on your belly (face-down) with your arms at your sides, and rest your forehead on the floor. 2. Tighten the muscles in your legs and your butt. 3. Slowly lift your chest off of the floor while you keep your hips on the floor. Keep the back of your head in line with the curve in your back. Look at the floor while you do this. 4. Stay in this position for 3-5 seconds. 5. Slowly lower your chest and your face to the floor. Contact a doctor if:  Your back pain gets a lot worse when you do an exercise.  Your back pain does not get better 2 hours after you exercise. If you have any of these problems, stop doing the exercises. Do not do them again unless your doctor says it is okay. Get help right away if:  You have sudden, very bad back pain.   If this happens, stop doing the exercises. Do not do them again unless your doctor says it is  okay. This information is not intended to replace advice given to you by your health care provider. Make sure you discuss any questions you have with your health care provider. Document Revised: 08/19/2018 Document Reviewed: 08/19/2018 Elsevier Patient Education  2020 [employer] Inc.  

## 2020-07-31 ENCOUNTER — Other Ambulatory Visit: Payer: Self-pay | Admitting: Family Medicine

## 2020-07-31 DIAGNOSIS — M545 Low back pain, unspecified: Secondary | ICD-10-CM

## 2020-07-31 DIAGNOSIS — M25551 Pain in right hip: Secondary | ICD-10-CM

## 2020-07-31 DIAGNOSIS — M62830 Muscle spasm of back: Secondary | ICD-10-CM

## 2020-07-31 MED ORDER — TIZANIDINE HCL 2 MG PO CAPS: 2.0000 mg | ORAL_CAPSULE | Freq: Three times a day (TID) | ORAL | 0 refills | Status: DC

## 2020-07-31 NOTE — Telephone Encounter (Signed)
Patient said she called the [facility] in [place] and they said they do not have the Zanaflex that was sent last Friday.  She confirmed we had sent it to the correct place but they are saying they didn't get it.  Patient would like [place] to resend it.

## 2020-10-09 ENCOUNTER — Other Ambulatory Visit: Payer: Self-pay | Admitting: Nurse Practitioner

## 2020-10-11 ENCOUNTER — Ambulatory Visit: Payer: 59 | Attending: Internal Medicine

## 2020-10-11 DIAGNOSIS — Z23 Encounter for immunization: Secondary | ICD-10-CM

## 2020-10-11 NOTE — Progress Notes (Signed)
   Covid-19 Vaccination [facility]  Name:  Sharon Bray    MRN: [id number] DOB: 04-May-1966  10/11/2020  Sharon Bray was observed post Covid-19 immunization for 15 minutes without incident. She was provided with Vaccine Information Sheet and instruction to access the V-Safe system.   Sharon Bray was instructed to call 911 with any severe reactions post vaccine: Marland Kitchen Difficulty breathing  . Swelling of face and throat  . A fast heartbeat  . A bad rash all over body  . Dizziness and weakness

## 2020-11-19 ENCOUNTER — Encounter: Payer: Self-pay | Admitting: Family Medicine

## 2020-11-19 ENCOUNTER — Other Ambulatory Visit: Payer: Self-pay

## 2020-11-19 ENCOUNTER — Ambulatory Visit (HOSPITAL_COMMUNITY)
Admission: RE | Admit: 2020-11-19 | Discharge: 2020-11-19 | Disposition: A | Discharge status: Home / Self Care | Payer: 59 | Source: Ambulatory Visit | Attending: Family Medicine | Admitting: Family Medicine

## 2020-11-19 ENCOUNTER — Ambulatory Visit: Payer: 59 | Admitting: Family Medicine

## 2020-11-19 VITALS — BP 132/74 | HR 93 | Temp 97.7°F | Ht 65.5 in | Wt 231.4 lb

## 2020-11-19 DIAGNOSIS — M545 Low back pain, unspecified: Secondary | ICD-10-CM | POA: Insufficient documentation

## 2020-11-19 DIAGNOSIS — M25552 Pain in left hip: Secondary | ICD-10-CM

## 2020-11-19 DIAGNOSIS — R7302 Impaired glucose tolerance (oral): Secondary | ICD-10-CM | POA: Diagnosis not present

## 2020-11-19 DIAGNOSIS — M25551 Pain in right hip: Secondary | ICD-10-CM | POA: Diagnosis present

## 2020-11-19 DIAGNOSIS — I10 Essential (primary) hypertension: Secondary | ICD-10-CM

## 2020-11-19 DIAGNOSIS — R7303 Prediabetes: Secondary | ICD-10-CM | POA: Diagnosis not present

## 2020-11-19 DIAGNOSIS — M62830 Muscle spasm of back: Secondary | ICD-10-CM

## 2020-11-19 DIAGNOSIS — M79641 Pain in right hand: Secondary | ICD-10-CM

## 2020-11-19 DIAGNOSIS — M79642 Pain in left hand: Secondary | ICD-10-CM

## 2020-11-19 MED ORDER — POTASSIUM CHLORIDE ER 10 MEQ PO TBCR
10.0000 meq | EXTENDED_RELEASE_TABLET | Freq: Every day | ORAL | 1 refills | Status: DC
Start: 2020-11-19 — End: 2021-06-28

## 2020-11-19 MED ORDER — METFORMIN HCL 500 MG PO TABS
500.0000 mg | ORAL_TABLET | Freq: Two times a day (BID) | ORAL | 0 refills | Status: DC
Start: 2020-11-19 — End: 2021-03-25

## 2020-11-19 MED ORDER — LISINOPRIL 10 MG PO TABS: 10.0000 mg | ORAL_TABLET | Freq: Every day | ORAL | 1 refills | Status: DC

## 2020-11-19 MED ORDER — TIZANIDINE HCL 4 MG PO CAPS: 4.0000 mg | ORAL_CAPSULE | Freq: Three times a day (TID) | ORAL | 1 refills | Status: DC

## 2020-11-19 MED ORDER — HYDROCHLOROTHIAZIDE 25 MG PO TABS
25.0000 mg | ORAL_TABLET | Freq: Every day | ORAL | 1 refills | Status: DC
Start: 2020-11-19 — End: 2021-10-11

## 2020-11-19 NOTE — Progress Notes (Signed)
Patient ID: Sharon Bray, female    DOB: 1966-09-06, 54 y.o.   MRN: [id number]   Chief Complaint  Patient presents with  . Hypertension  . Back Pain   Subjective:    HPI   Pt here for 6 month follow up. No issues with blood pressure. Taking meds as directed. Pt is having some lower back pain that has been going on for a while.   Has lower back pain and pain radiating toward the hips. Seen last visit for rt hip pain.  Ordered xrays, but didn't get them. Getting sore if moving a lot at work, with breaking down boxes and lifting. Hands and arms/legs. Helping more at work, doing more things with arms than usual. Taking ibuprofen and zanaflex and resting helps.  Pre-diabetes- no inc in thirst or urination.  Taking metformin, no new concerns.  HTN Pt compliant with BP meds.  No SEs Denies chest pain, sob, LE swelling, or blurry vision.   Medical History Sharon Bray has a past medical history of Allergy, Arthritis, Impaired fasting glucose, and Sebaceous cyst of breast (2012).   Outpatient Encounter Medications as of 11/19/2020  Medication Sig  . acyclovir (ZOVIRAX) 400 MG tablet TAKE 1 TABLET BY MOUTH 3 TIMES A DAY FOR 5 DAYS  . Blood Glucose Monitoring Suppl (ONE TOUCH ULTRA 2) w/Device KIT USE AS DIRECTED  . diclofenac (VOLTAREN) 75 MG EC tablet Take 1 tablet (75 mg total) by mouth 2 (two) times daily.  Glory Rosebush Delica Lancets 69I MISC USE AS DIRECTED DAILY  . ONETOUCH ULTRA test strip USE AS DIRECTED  . pantoprazole (PROTONIX) 40 MG tablet TAKE 1 TABLET BY MOUTH EVERY DAY  . [DISCONTINUED] hydrochlorothiazide (HYDRODIURIL) 25 MG tablet TAKE 1 TABLET BY MOUTH EVERY DAY  . [DISCONTINUED] lisinopril (ZESTRIL) 10 MG tablet TAKE 1 TABLET BY MOUTH EVERY DAY  . [DISCONTINUED] metFORMIN (GLUCOPHAGE) 500 MG tablet TAKE 1 TABLET BY MOUTH TWICE A DAY WITH MEALS  . [DISCONTINUED] potassium chloride (KLOR-CON) 10 MEQ tablet Take 1 tablet (10 mEq total) by mouth daily.  .  [DISCONTINUED] tizanidine (ZANAFLEX) 2 MG capsule Take 1 capsule (2 mg total) by mouth 3 (three) times daily. For muscle spasm.  . hydrochlorothiazide (HYDRODIURIL) 25 MG tablet Take 1 tablet (25 mg total) by mouth daily.  Marland Kitchen lisinopril (ZESTRIL) 10 MG tablet Take 1 tablet (10 mg total) by mouth daily.  . metFORMIN (GLUCOPHAGE) 500 MG tablet Take 1 tablet (500 mg total) by mouth 2 (two) times daily with a meal.  . potassium chloride (KLOR-CON) 10 MEQ tablet Take 1 tablet (10 mEq total) by mouth daily.  Marland Kitchen tiZANidine (ZANAFLEX) 4 MG capsule Take 1 capsule (4 mg total) by mouth 3 (three) times daily. For muscle spasm.   Facility-Administered Encounter Medications as of 11/19/2020  Medication  . betamethasone acetate-betamethasone sodium phosphate (CELESTONE) injection 3 mg  . betamethasone acetate-betamethasone sodium phosphate (CELESTONE) injection 3 mg     Review of Systems  Constitutional: Negative for chills and fever.  HENT: Negative for congestion, rhinorrhea and sore throat.   Respiratory: Negative for cough, shortness of breath and wheezing.   Cardiovascular: Negative for chest pain and leg swelling.  Gastrointestinal: Negative for abdominal pain, diarrhea, nausea and vomiting.  Genitourinary: Negative for dysuria and frequency.  Musculoskeletal: Positive for arthralgias (bilateral hand pain, bilat hip pain) and back pain.  Skin: Negative for rash.  Neurological: Negative for dizziness, weakness and headaches.     Vitals BP 132/74  Pulse 93   Temp 97.7 F (36.5 C)   Ht 5' 5.5" (1.664 m)   Wt 231 lb 6.4 oz (105 kg)   SpO2 97%   BMI 37.92 kg/m   Objective:   Physical Exam Vitals and nursing note reviewed.  Constitutional:      General: She is not in acute distress.    Appearance: Normal appearance. She is not ill-appearing.  HENT:     Head: Normocephalic and atraumatic.     Nose: Nose normal.     Mouth/Throat:     Mouth: Mucous membranes are moist.     Pharynx:  Oropharynx is clear.  Eyes:     Extraocular Movements: Extraocular movements intact.     Conjunctiva/sclera: Conjunctivae normal.     Pupils: Pupils are equal, round, and reactive to light.  Cardiovascular:     Rate and Rhythm: Normal rate and regular rhythm.     Pulses: Normal pulses.     Heart sounds: Normal heart sounds.  Pulmonary:     Effort: Pulmonary effort is normal.     Breath sounds: Normal breath sounds. No wheezing, rhonchi or rales.  Musculoskeletal:        General: No swelling, deformity or signs of injury. Normal range of motion.     Right lower leg: No edema.     Left lower leg: No edema.     Comments: +ttp over lumbar spinous process L3-L5.  No  thoracic spinous process tenderness.  Dec rom with flexion. Positive SLR bilaterally.  Normal ms LE bilaterally and normal sensation. Normal hip rom and normal knee rom.  Normal rom of hands, no swelling, erythema or injury.  Skin:    General: Skin is warm and dry.     Findings: No lesion or rash.  Neurological:     General: No focal deficit present.     Mental Status: She is alert and oriented to person, place, and time.     Cranial Nerves: No cranial nerve deficit.  Psychiatric:        Mood and Affect: Mood normal.        Behavior: Behavior normal.      Assessment and Plan   1. Prediabetes  2. Impaired glucose tolerance - CBC - CMP14+EGFR - Hemoglobin A1c - Lipid panel  3. Lumbar back pain - Ambulatory referral to Physical Therapy  4. Bilateral hip pain - Ambulatory referral to Physical Therapy  5. Bilateral hand pain - Ambulatory referral to Physical Therapy  6. Hip pain, acute, right - tiZANidine (ZANAFLEX) 4 MG capsule; Take 1 capsule (4 mg total) by mouth 3 (three) times daily. For muscle spasm.  Dispense: 30 capsule; Refill: 1  7. Lumbar pain - tiZANidine (ZANAFLEX) 4 MG capsule; Take 1 capsule (4 mg total) by mouth 3 (three) times daily. For muscle spasm.  Dispense: 30 capsule; Refill:  1  8. Lumbar paraspinal muscle spasm - tiZANidine (ZANAFLEX) 4 MG capsule; Take 1 capsule (4 mg total) by mouth 3 (three) times daily. For muscle spasm.  Dispense: 30 capsule; Refill: 1  9. Essential hypertension, benign   Pt to get labs today. Refills sent.  Lumbar and hip pain- xrays ordered from last visit.  Pt to get them today. Cont with ibuprofen prn and zanaflex. Inc zanaflex from 18m to 467m since didn't feel relief with 74m64m PT for low back, hip pain, and hand pain ordered.  Hand pain- ibuprofen and referral to PT.  Impaired glucose/pre-diabetes- cont metformin.  Cont checking  bg.  Labs ordered.  htn- stable. Cont meds.   F/u [place] prn.

## 2020-11-20 LAB — CMP14+EGFR
ALT: 18 IU/L (ref 0–32)
AST: 17 IU/L (ref 0–40)
Albumin/Globulin Ratio: 1.5 (ref 1.2–2.2)
Albumin: 4.5 g/dL (ref 3.8–4.9)
Alkaline Phosphatase: 79 IU/L (ref 44–121)
BUN/Creatinine Ratio: 17 (ref 9–23)
BUN: 18 mg/dL (ref 6–24)
Bilirubin Total: 0.4 mg/dL (ref 0.0–1.2)
CO2: 22 mmol/L (ref 20–29)
Calcium: 9.9 mg/dL (ref 8.7–10.2)
Chloride: 102 mmol/L (ref 96–106)
Creatinine, Ser: 1.07 mg/dL — ABNORMAL HIGH (ref 0.57–1.00)
GFR calc Af Amer: 67 mL/min/{1.73_m2} (ref >59)
GFR calc non Af Amer: 58 mL/min/{1.73_m2} — ABNORMAL LOW (ref >59)
Globulin, Total: 3.1 g/dL (ref 1.5–4.5)
Glucose: 100 mg/dL — ABNORMAL HIGH (ref 65–99)
Potassium: 3.7 mmol/L (ref 3.5–5.2)
Sodium: 141 mmol/L (ref 134–144)
Total Protein: 7.6 g/dL (ref 6.0–8.5)

## 2020-11-20 LAB — LIPID PANEL
Chol/HDL Ratio: 4.1 ratio (ref 0.0–4.4)
Cholesterol, Total: 156 mg/dL (ref 100–199)
HDL: 38 mg/dL — ABNORMAL LOW (ref >39)
LDL Chol Calc (NIH): 91 mg/dL (ref 0–99)
Triglycerides: 157 mg/dL — ABNORMAL HIGH (ref 0–149)
VLDL Cholesterol Cal: 27 mg/dL (ref 5–40)

## 2020-11-20 LAB — CBC
Hematocrit: 39.1 % (ref 34.0–46.6)
Hemoglobin: 13.2 g/dL (ref 11.1–15.9)
MCH: 29.5 pg (ref 26.6–33.0)
MCHC: 33.8 g/dL (ref 31.5–35.7)
MCV: 88 fL (ref 79–97)
Platelets: 258 10*3/uL (ref 150–450)
RBC: 4.47 x10E6/uL (ref 3.77–5.28)
RDW: 13.4 % (ref 11.7–15.4)
WBC: 4.2 10*3/uL (ref 3.4–10.8)

## 2020-11-20 LAB — HEMOGLOBIN A1C
Est. average glucose Bld gHb Est-mCnc: 128 mg/dL
Hgb A1c MFr Bld: 6.1 % — ABNORMAL HIGH (ref 4.8–5.6)

## 2020-11-22 ENCOUNTER — Encounter: Payer: Self-pay | Admitting: *Deleted

## 2020-12-12 ENCOUNTER — Other Ambulatory Visit: Payer: Self-pay | Admitting: Family Medicine

## 2020-12-14 ENCOUNTER — Encounter: Payer: Self-pay | Admitting: Family Medicine

## 2020-12-14 ENCOUNTER — Other Ambulatory Visit: Payer: Self-pay

## 2020-12-14 ENCOUNTER — Ambulatory Visit: Payer: 59 | Admitting: Family Medicine

## 2020-12-14 VITALS — BP 128/88 | HR 105 | Temp 97.4°F | Ht 65.5 in | Wt 228.0 lb

## 2020-12-14 DIAGNOSIS — M545 Low back pain, unspecified: Secondary | ICD-10-CM

## 2020-12-14 DIAGNOSIS — N61 Mastitis without abscess: Secondary | ICD-10-CM | POA: Diagnosis not present

## 2020-12-14 MED ORDER — CEPHALEXIN 500 MG PO CAPS: 500.0000 mg | ORAL_CAPSULE | Freq: Four times a day (QID) | ORAL | 0 refills | Status: DC

## 2020-12-14 MED ORDER — NAPROXEN 500 MG PO TABS: 500.0000 mg | ORAL_TABLET | Freq: Two times a day (BID) | ORAL | 0 refills | Status: DC

## 2020-12-14 MED ORDER — CYCLOBENZAPRINE HCL 10 MG PO TABS: 10.0000 mg | ORAL_TABLET | Freq: Three times a day (TID) | ORAL | 0 refills | Status: DC | PRN

## 2020-12-14 NOTE — Progress Notes (Signed)
Patient ID: Sharon Bray, female    DOB: 1966/08/22, 55 y.o.   MRN: [id number]   Chief Complaint  Patient presents with  . Recurrent Skin Infections   Subjective:  CC: left side throbbing and recurrent boil on left breast  This is a new problem.  Presents today for an acute visit with a complaint of left side throbbing pain, feels tight and throbbing symptoms present for 2 weeks.  Pain is worse at night, when lying on the left side.  Has tried heating pad, ibuprofen, muscle relaxers, which do help somewhat.  Reports a recurrent left breast boil.  That has been present for 3 to 4 days.  Has happened more than 5 times in her life is always in the same area, up-to-date on her mammograms, she is due for her screening mammogram in February 2022.  She denies fever, chills, chest pain, shortness of breath. boil on breast for 2 days.   Left lower side pain for 2 weeks.    Medical History Sharon Bray has a past medical history of Allergy, Arthritis, Impaired fasting glucose, and Sebaceous cyst of breast (2012).   Outpatient Encounter Medications as of 12/14/2020  Medication Sig  . acyclovir (ZOVIRAX) 400 MG tablet TAKE 1 TABLET BY MOUTH 3 TIMES A DAY FOR 5 DAYS  . Blood Glucose Monitoring Suppl (ONE TOUCH ULTRA 2) w/Device KIT USE AS DIRECTED  . cephALEXin (KEFLEX) 500 MG capsule Take 1 capsule (500 mg total) by mouth 4 (four) times daily.  . cyclobenzaprine (FLEXERIL) 10 MG tablet Take 1 tablet (10 mg total) by mouth 3 (three) times daily as needed for muscle spasms.  . hydrochlorothiazide (HYDRODIURIL) 25 MG tablet Take 1 tablet (25 mg total) by mouth daily.  Marland Kitchen lisinopril (ZESTRIL) 10 MG tablet Take 1 tablet (10 mg total) by mouth daily.  . metFORMIN (GLUCOPHAGE) 500 MG tablet Take 1 tablet (500 mg total) by mouth 2 (two) times daily with a meal.  . naproxen (NAPROSYN) 500 MG tablet Take 1 tablet (500 mg total) by mouth 2 (two) times daily with a meal.  . OneTouch Delica Lancets 19T MISC USE AS  DIRECTED DAILY  . ONETOUCH ULTRA test strip USE AS DIRECTED  . pantoprazole (PROTONIX) 40 MG tablet TAKE 1 TABLET BY MOUTH EVERY DAY  . potassium chloride (KLOR-CON) 10 MEQ tablet Take 1 tablet (10 mEq total) by mouth daily.  Marland Kitchen tiZANidine (ZANAFLEX) 4 MG capsule Take 1 capsule (4 mg total) by mouth 3 (three) times daily. For muscle spasm.  . [DISCONTINUED] diclofenac (VOLTAREN) 75 MG EC tablet Take 1 tablet (75 mg total) by mouth 2 (two) times daily.   Facility-Administered Encounter Medications as of 12/14/2020  Medication  . betamethasone acetate-betamethasone sodium phosphate (CELESTONE) injection 3 mg  . betamethasone acetate-betamethasone sodium phosphate (CELESTONE) injection 3 mg     Review of Systems  Constitutional: Negative for chills and fever.  Respiratory: Negative for shortness of breath.   Cardiovascular: Negative for chest pain.  Musculoskeletal: Positive for back pain.  Skin:       Left breast with recurrent abscess present for 3 to 4 days.     Vitals BP 128/88   Pulse (!) 105   Temp (!) 97.4 F (36.3 C)   Ht 5' 5.5" (1.664 m)   Wt 228 lb (103.4 kg)   SpO2 98%   BMI 37.36 kg/m   Objective:   Physical Exam Vitals reviewed.  Constitutional:      General: She is not in  acute distress.    Appearance: Normal appearance.  Cardiovascular:     Rate and Rhythm: Normal rate and regular rhythm.     Heart sounds: Normal heart sounds.  Pulmonary:     Effort: Pulmonary effort is normal.     Breath sounds: Normal breath sounds.  Skin:    General: Skin is warm and dry.     Comments: Large firm area present on left lateral breast area tender to touch minimal erythema.  Neurological:     General: No focal deficit present.     Mental Status: She is alert.     Motor: No weakness.     Gait: Gait and tandem walk normal.     Comments: 5/5 upper and lower extremity strength.  Negative leg raise.  Pain worse at night and with lying on left side.  Denies saddle  paresthesia, able to walk on toes and heels, denies bowel and bladder involvement.  Psychiatric:        Behavior: Behavior normal.      Assessment and Plan   1. Acute left-sided low back pain without sciatica - cyclobenzaprine (FLEXERIL) 10 MG tablet; Take 1 tablet (10 mg total) by mouth 3 (three) times daily as needed for muscle spasms.  Dispense: 30 tablet; Refill: 0 - naproxen (NAPROSYN) 500 MG tablet; Take 1 tablet (500 mg total) by mouth 2 (two) times daily with a meal.  Dispense: 30 tablet; Refill: 0  2. Cellulitis of left breast - cephALEXin (KEFLEX) 500 MG capsule; Take 1 capsule (500 mg total) by mouth 4 (four) times daily.  Dispense: 40 capsule; Refill: 0 - Ambulatory referral to General Surgery   Left-sided low back pain: Negative neuro exam today, no red flags.  Likely muscular in nature.  Will utilize RICE.  She will take anti-inflammatories twice per day with food and use muscle relaxers at night.  Back exercises given.  Cellulitis of the left breast:   She will start Keflex twice daily and will refer to general surgery for definitive treatment.  This is a recurrent issue, has happened to her greater than 5 times and is always in the same area.  Agrees with plan of care discussed today. Understands warning signs to seek further care: Chest pain, shortness of breath, any significant change in health, lower extremity weakness, bowel and bladder incontinence, paresthesia in the saddle area. Understands to follow-up in 1 month, for back pain, sooner if anything changes.  She will follow-up with general surgery for definitive treatment for the cellulitis/abscess of the left breast.  She understands to take the entire course of the antibiotic.  Pecolia Ades, FNP-C    12/14/2020

## 2020-12-14 NOTE — Patient Instructions (Signed)
Take Naproxen with food. Use muscle relaxants at night before bed time. Continue with heating pad (protect your skin) Take antibiotic for breast abscess We will make referral to general surgeon.    Back Exercises These exercises help to make your trunk and back strong. They also help to keep the lower back flexible. Doing these exercises can help to prevent back pain or lessen existing pain.  If you have back pain, try to do these exercises 2-3 times each day or as told by your doctor.  As you get better, do the exercises once each day. Repeat the exercises more often as told by your doctor.  To stop back pain from coming back, do the exercises once each day, or as told by your doctor. Exercises Single knee to chest Do these steps 3-5 times in a row for each leg: 1. Lie on your back on a firm bed or the floor with your legs stretched out. 2. Bring one knee to your chest. 3. Grab your knee or thigh with both hands and hold them it in place. 4. Pull on your knee until you feel a gentle stretch in your lower back or buttocks. 5. Keep doing the stretch for 10-30 seconds. 6. Slowly let go of your leg and straighten it. Pelvic tilt Do these steps 5-10 times in a row: 1. Lie on your back on a firm bed or the floor with your legs stretched out. 2. Bend your knees so they point up to the ceiling. Your feet should be flat on the floor. 3. Tighten your lower belly (abdomen) muscles to press your lower back against the floor. This will make your tailbone point up to the ceiling instead of pointing down to your feet or the floor. 4. Stay in this position for 5-10 seconds while you gently tighten your muscles and breathe evenly. Cat-cow Do these steps until your lower back bends more easily: 1. Get on your hands and knees on a firm surface. Keep your hands under your shoulders, and keep your knees under your hips. You may put padding under your knees. 2. Let your head hang down toward your chest.  Tighten (contract) the muscles in your belly. Point your tailbone toward the floor so your lower back becomes rounded like the back of a cat. 3. Stay in this position for 5 seconds. 4. Slowly lift your head. Let the muscles of your belly relax. Point your tailbone up toward the ceiling so your back forms a sagging arch like the back of a cow. 5. Stay in this position for 5 seconds.  Press-ups Do these steps 5-10 times in a row: 1. Lie on your belly (face-down) on the floor. 2. Place your hands near your head, about shoulder-width apart. 3. While you keep your back relaxed and keep your hips on the floor, slowly straighten your arms to raise the top half of your body and lift your shoulders. Do not use your back muscles. You may change where you place your hands in order to make yourself more comfortable. 4. Stay in this position for 5 seconds. 5. Slowly return to lying flat on the floor.  Bridges Do these steps 10 times in a row: 1. Lie on your back on a firm surface. 2. Bend your knees so they point up to the ceiling. Your feet should be flat on the floor. Your arms should be flat at your sides, next to your body. 3. Tighten your butt muscles and lift your butt off  the floor until your waist is almost as high as your knees. If you do not feel the muscles working in your butt and the back of your thighs, slide your feet 1-2 inches farther away from your butt. 4. Stay in this position for 3-5 seconds. 5. Slowly lower your butt to the floor, and let your butt muscles relax. If this exercise is too easy, try doing it with your arms crossed over your chest. Belly crunches Do these steps 5-10 times in a row: 1. Lie on your back on a firm bed or the floor with your legs stretched out. 2. Bend your knees so they point up to the ceiling. Your feet should be flat on the floor. 3. Cross your arms over your chest. 4. Tip your chin a little bit toward your chest but do not bend your neck. 5. Tighten  your belly muscles and slowly raise your chest just enough to lift your shoulder blades a tiny bit off of the floor. Avoid raising your body higher than that, because it can put too much stress on your low back. 6. Slowly lower your chest and your head to the floor. Back lifts Do these steps 5-10 times in a row: 1. Lie on your belly (face-down) with your arms at your sides, and rest your forehead on the floor. 2. Tighten the muscles in your legs and your butt. 3. Slowly lift your chest off of the floor while you keep your hips on the floor. Keep the back of your head in line with the curve in your back. Look at the floor while you do this. 4. Stay in this position for 3-5 seconds. 5. Slowly lower your chest and your face to the floor. Contact a doctor if:  Your back pain gets a lot worse when you do an exercise.  Your back pain does not get better 2 hours after you exercise. If you have any of these problems, stop doing the exercises. Do not do them again unless your doctor says it is okay. Get help right away if:  You have sudden, very bad back pain. If this happens, stop doing the exercises. Do not do them again unless your doctor says it is okay. This information is not intended to replace advice given to you by your health care provider. Make sure you discuss any questions you have with your health care provider. Document Revised: 08/19/2018 Document Reviewed: 08/19/2018 Elsevier Patient Education  [place] aguda nas [place], [place] Acute Back Pain, Adult A dor aguda nas costas ocorre de forma sbita e geralmente tem curta durao. Muitas vezes,  causada por uma leso nos msculos e tecidos [employer]. A leso pode ser causada por:  Um msculo ou ligamento hiperestendido ou rompido (estiramento). Os ligamentos so faixas de tecido que conectam os ossos uns aos outros. Levantar algo de maneira inadequada pode causar um estiramento nas costas.  Desgaste e leso  (degenerao) dos discos da coluna vertebral. Os discos da coluna vertebral so tecidos circulares que fornecem amortecimento entre os ossos da coluna (vrtebras).  Movimento de [place], como ao praticar esportes ou ao fazer trabalho de jardinagem.  Uma pancada nas costas.  Artrite. Voc pode ter que fazer um exame fsico, exames laboratoriais e exames de imagem para encontrar a Public librarian. A dor aguda nas costas geralmente desaparece com repouso e cuidados em casa. Siga essas instrues em casa: Como tratar a dor, a [place] medicamentos vendidos  com ou sem receita mdica somente de acordo com as indicaes do seu mdico.  Seu mdico pode recomendar aplicao de gelo durante as primeiras 24-48 horas aps o incio da sua dor. Para fazer isso: ? Coloque gelo em uma sacola plstica. ? Coloque uma toalha entre a pele e a sacola. ? Deixe o gelo por 20 minutos, de 2-3 vezes ao dia.  Se orientado a faz-lo, aplique calor  rea afetada com a frequncia determinada pelo seu mdico. Use a fonte de calor que seu mdico recomendar, como uma compressa quente mida ou uma almofada trmica. ? Coloque uma toalha entre a pele e a fonte de calor. ? Deixe o calor atuar por 20-30 minutos. ? Remova a fonte de calor caso a pele fique avermelhada. Isso ser especialmente importante se voc no conseguir sentir dor, calor ou frio. Voc poder ter maior risco de sofrer uma queimadura. Atividades   No permanea na cama. Permanecer na cama por mais de 1-2 dias pode atrasar [employer].  Sente-se e fique em p com as costas eretas. Evite inclinar-se para frente quando estiver sentado ou encolher os ombros quando estiver em p. ? Caso trabalhe em uma escrivaninha, sente-se junto dela para no precisar se inclinar. Mantenha a cabea ereta. Mantenha o pescoo reto e os cotovelos em ngulo de noventa graus. Seus braos precisam formar uma letra "L". ? Ao dirigir, sente-se com as costas  retas e prximo do volante. Coloque um assento para as costas (Control and instrumentation engineer) no banco do carro, se necessrio.  Faa pequenas caminhadas em lugares planos assim que estiver apto. Tente aumentar o tempo que voc caminha a cada dia.  No permanea sentado, em p ou dirigindo por mais de 30 minutos seguidos. Sentar ou ficar em p por longos perodos de tempo pode sobrecarregar suas costas.  No dirija nem use mquinas pesadas enquanto estiver tomando analgsicos vendidos com receita mdica.  Use tcnicas adequadas para pegar e erguer objetos. Ao se abaixar para pegar algo, use posies que sobrecarreguem menos as costas: ? Dobre os joelhos. ? Mantenha a carga prximo ao corpo. ? Evite girar o tronco.  Exercite-se regularmente de acordo com as indicaes do seu mdico. O exerccio ajuda a melhorar as Psychiatrist e a prevenir leses nas costas, pois mantm os msculos fortes e flexveis.  Consulte um fisioterapeuta para fazer um programa de exerccios seguro, conforme recomendado pelo seu mdico. Faa todos os exerccios de acordo com as orientaes do seu fisioterapeuta. Estilo de vida  Mantenha um peso saudvel. O excesso de peso fora suas costas e dificulta a manuteno de [employer].  Evite atividades ou situaes que faam voc se sentir ansioso ou estressado. O estresse e a ansiedade aumentam a tenso muscular e podem piorar a dor nas costas. Aprenda maneiras de controlar a ansiedade e o estresse, por exemplo, por meio de exerccios fsicos. Instrues gerais  Durma em um colcho firme em uma posio confortvel. Procure deitar de lado com os joelhos levemente flexionados. Ao deitar com a barriga para cima, coloque um travesseiro embaixo de seus joelhos.  Siga o plano de tratamento de acordo com as instrues do seu mdico. Isso pode incluir: ? Terapia cognitiva ou comportamental. ? Acupuntura ou massoterapia. ? Meditao ou ioga. Entre em contato com um mdico  se:  Tiver dor que no alivia com repouso ou medicamentos.  Tiver piora na dor, estendendo-se para as pernas ou glteos.  A dor no melhorar aps 2 semanas.  Tiver dor durante a noite.  Perder peso de [place] no intencional.  Tiver febre ou calafrios. Tomasa Hose ajuda imediatamente se:  Desenvolver novos problemas no controle do intestino ou bexiga.  Tiver dormncia ou fraqueza anormal nos braos ou pernas.  Desenvolver enjoo ou vmitos.  Desenvolver dores abdominais.  Sentir tontura. Resumo  A dor aguda nas costas ocorre de forma sbita e geralmente tem curta durao.  Use tcnicas adequadas para pegar e erguer objetos. Ao se abaixar para pegar algo, use posies que sobrecarreguem menos as costas.  Tome medicamentos vendidos com ou sem prescrio e aplique calor ou gelo conforme orientado pelo seu mdico. Estas informaes no se destinam a substituir as recomendaes de seu mdico. No deixe de discutir quaisquer dvidas com seu mdico. Document Revised: 07/02/2018 Document Reviewed: 09/10/2017 Elsevier Patient Education  2020 [employer].

## 2020-12-20 ENCOUNTER — Telehealth (INDEPENDENT_AMBULATORY_CARE_PROVIDER_SITE_OTHER): Payer: 59 | Admitting: Family Medicine

## 2020-12-20 ENCOUNTER — Encounter: Payer: Self-pay | Admitting: Family Medicine

## 2020-12-20 ENCOUNTER — Other Ambulatory Visit: Payer: Self-pay

## 2020-12-20 ENCOUNTER — Telehealth: Payer: Self-pay | Admitting: Family Medicine

## 2020-12-20 DIAGNOSIS — R1012 Left upper quadrant pain: Secondary | ICD-10-CM | POA: Diagnosis not present

## 2020-12-20 NOTE — Progress Notes (Signed)
Pt having side pain. Pt seen 12/14/20 for low back pain. Pt states muscle relaxer helps but can not take them at work. Laying down or sitting a certain way aggravates pain.   Virtual Visit via Video Note  I connected with Sharon Bray on 12/20/20 at  9:30 AM EST by a video enabled telemedicine application and verified that I am speaking with the correct person using two identifiers.  Location: Patient: home Provider: office   I discussed the limitations of evaluation and management by telemedicine and the availability of in person appointments. The patient expressed understanding and agreed to proceed.  History of Present Illness:    Observations/Objective:   Assessment and Plan:   Follow Up Instructions:    I discussed the assessment and treatment plan with the patient. The patient was provided an opportunity to ask questions and all were answered. The patient agreed with the plan and demonstrated an understanding of the instructions.   The patient was advised to call back or seek an in-person evaluation if the symptoms worsen or if the condition fails to improve as anticipated.  I provided 22 minutes of non-face-to-face time during this encounter.    Patient ID: Sharon Bray, female    DOB: [birth year], 55 y.o.   MRN: [id number]   Chief Complaint  Patient presents with  . Flank Pain   Subjective:  CC: left sided pain  This is not a new problem.  Presents today via video visit for follow-up for left-sided low back pain.  Was initially seen on January 7 prescribed muscle relaxant and naproxen at that time.  Reports that the muscle relaxers and the naproxen have helped but pain is still present.  Pain has been present now for about 3 to 4 weeks.  Denies fever, chills, chest pain, shortness of breath.  She reports that she believes that the pain had resolved but then this past Tuesday she did more activity and pain returned and is worse now when lying or sitting in a certain  position.  Pain is now located under the left rib cage, and not in the left low back.    Medical History Vy has a past medical history of Allergy, Arthritis, Impaired fasting glucose, and Sebaceous cyst of breast (2012).   Outpatient Encounter Medications as of 12/20/2020  Medication Sig  . acyclovir (ZOVIRAX) 400 MG tablet TAKE 1 TABLET BY MOUTH 3 TIMES A DAY FOR 5 DAYS  . Blood Glucose Monitoring Suppl (ONE TOUCH ULTRA 2) w/Device KIT USE AS DIRECTED  . cephALEXin (KEFLEX) 500 MG capsule Take 1 capsule (500 mg total) by mouth 4 (four) times daily.  . cyclobenzaprine (FLEXERIL) 10 MG tablet Take 1 tablet (10 mg total) by mouth 3 (three) times daily as needed for muscle spasms.  . hydrochlorothiazide (HYDRODIURIL) 25 MG tablet Take 1 tablet (25 mg total) by mouth daily.  Marland Kitchen lisinopril (ZESTRIL) 10 MG tablet Take 1 tablet (10 mg total) by mouth daily.  . metFORMIN (GLUCOPHAGE) 500 MG tablet Take 1 tablet (500 mg total) by mouth 2 (two) times daily with a meal.  . naproxen (NAPROSYN) 500 MG tablet Take 1 tablet (500 mg total) by mouth 2 (two) times daily with a meal.  . OneTouch Delica Lancets 33G MISC USE AS DIRECTED DAILY  . ONETOUCH ULTRA test strip USE AS DIRECTED  . pantoprazole (PROTONIX) 40 MG tablet TAKE 1 TABLET BY MOUTH EVERY DAY  . potassium chloride (KLOR-CON) 10 MEQ tablet Take 1 tablet (10 mEq  total) by mouth daily.  Marland Kitchen tiZANidine (ZANAFLEX) 4 MG capsule Take 1 capsule (4 mg total) by mouth 3 (three) times daily. For muscle spasm.   Facility-Administered Encounter Medications as of 12/20/2020  Medication  . betamethasone acetate-betamethasone sodium phosphate (CELESTONE) injection 3 mg  . betamethasone acetate-betamethasone sodium phosphate (CELESTONE) injection 3 mg     Review of Systems  Constitutional: Negative for chills and fever.  Respiratory: Negative for shortness of breath.   Cardiovascular: Negative for chest pain.  Gastrointestinal: Negative for abdominal  pain.  Musculoskeletal:       Pain under left rib cage.   Neurological: Negative for weakness.     Vitals There were no vitals taken for this visit. Unable- reports she takes her blood pressure occasionally ( not today) Objective:   Physical Exam  unable Assessment and Plan   1. LUQ pain - CT ABDOMEN W CONTRAST   Pain now located under her left rib cage toward the side of the body.  Present now for 3 to 4 weeks, thought it was getting better.  December 13 she had a negative lumbar spine and negative right hip x-rays.  Next steps CT scan--- order placed.  Agrees with plan of care discussed today. Understands warning signs to seek further care: Chest pain, shortness of breath, any significant change in health, weakness, numbness tingling, saddle paresthesia. Understands to follow-up will be determined once CT scan results are available.    Chalmers Guest, NP 12/20/2020

## 2020-12-20 NOTE — Telephone Encounter (Signed)
Ms. patrizia, paule are scheduled for a virtual visit with your provider today.    Just as we do with appointments in the office, we must obtain your consent to participate.  Your consent will be active for this visit and any virtual visit you may have with one of our providers in the next 365 days.    If you have a MyChart account, I can also send a copy of this consent to you electronically.  All virtual visits are billed to your insurance company just like a traditional visit in the office.  As this is a virtual visit, video technology does not allow for your provider to perform a traditional examination.  This may limit your provider's ability to fully assess your condition.  If your provider identifies any concerns that need to be evaluated in person or the need to arrange testing such as labs, EKG, etc, we will make arrangements to do so.    Although advances in technology are sophisticated, we cannot ensure that it will always work on either your end or our end.  If the connection with a video visit is poor, we may have to switch to a telephone visit.  With either a video or telephone visit, we are not always able to ensure that we have a secure connection.   I need to obtain your verbal consent now.   Are you willing to proceed with your visit today?   Hoorain L Keng has provided verbal consent on 12/20/2020 for a virtual visit (video or telephone).   Marlowe Shores, LPN 4/65/0354  6:56 AM

## 2020-12-25 ENCOUNTER — Other Ambulatory Visit: Payer: Self-pay | Admitting: Family Medicine

## 2020-12-25 DIAGNOSIS — M545 Low back pain, unspecified: Secondary | ICD-10-CM

## 2020-12-26 ENCOUNTER — Other Ambulatory Visit: Payer: Self-pay | Admitting: Family Medicine

## 2020-12-26 ENCOUNTER — Telehealth: Payer: Self-pay | Admitting: Family Medicine

## 2020-12-26 DIAGNOSIS — M545 Low back pain, unspecified: Secondary | ICD-10-CM

## 2020-12-27 ENCOUNTER — Telehealth: Payer: Self-pay | Admitting: *Deleted

## 2020-12-27 NOTE — Telephone Encounter (Signed)
I don't have any advice about contrast. Can we just tell Toni Amend about her wishing to go to [facility] for it?    KD

## 2020-12-27 NOTE — Telephone Encounter (Signed)
Patient called back stating she is unable to pick up contrast today therefore had to reschedule her CT scan. The first thing [facility] Hawking had was 2/14. Patient would like to see if she can get this done sooner somewhere else (is willing to go to [facility]). Patient also mention being hesitant about drinking the contrast and her sister actually just has an allergic reaction to this last week. Please advise if you have any recommendations regarding contrast.

## 2020-12-28 ENCOUNTER — Ambulatory Visit (HOSPITAL_COMMUNITY): Admission: RE | Admit: 2020-12-28 | Payer: 59 | Source: Ambulatory Visit

## 2021-01-10 ENCOUNTER — Encounter: Payer: Self-pay | Admitting: Family Medicine

## 2021-01-11 ENCOUNTER — Ambulatory Visit (HOSPITAL_COMMUNITY): Admission: RE | Admit: 2021-01-11 | Payer: 59 | Source: Ambulatory Visit

## 2021-01-14 ENCOUNTER — Ambulatory Visit: Payer: 59 | Admitting: Family Medicine

## 2021-01-20 ENCOUNTER — Other Ambulatory Visit: Payer: Self-pay | Admitting: Family Medicine

## 2021-01-20 DIAGNOSIS — M545 Low back pain, unspecified: Secondary | ICD-10-CM

## 2021-01-21 ENCOUNTER — Telehealth: Payer: Self-pay | Admitting: Family Medicine

## 2021-01-21 ENCOUNTER — Ambulatory Visit (HOSPITAL_COMMUNITY): Payer: 59

## 2021-01-21 NOTE — Telephone Encounter (Signed)
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 It is still recommended to not stay on NSAIDS long-term. NSAIDs are intended for short-term use only.  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 It is intended for short-term therapy. Thanks, Clydie Braun

## 2021-01-21 NOTE — Telephone Encounter (Signed)
Novella Olive, NP  Marlowe Shores, LPN Notify Sharon Bray that I looked at the non african american GFR and hers is good. It is still recommended to not stay on NSAIDS long-term. NSAIDs are intended for short-term use only.   Thanks, Clydie Braun        Previous Messages   ----- Message -----  From: Marlowe Shores, LPN  Sent: 3/61/2244  4:06 PM EST  To: Novella Olive, NP    ----- Message -----  From: Novella Olive, NP  Sent: 01/21/2021  3:56 PM EST  To: Marlowe Shores, LPN   Please notify Sharon Bray that her kidney function has declined slightly and we should not continue taking Naproxen. It is intended for short-term therapy.  Thanks, Clydie Braun    Contacted patient and pt is aware and pt verbalized understanding

## 2021-02-15 ENCOUNTER — Ambulatory Visit: Payer: 59 | Admitting: Nurse Practitioner

## 2021-03-24 ENCOUNTER — Other Ambulatory Visit: Payer: Self-pay | Admitting: Family Medicine

## 2021-03-27 ENCOUNTER — Other Ambulatory Visit (HOSPITAL_BASED_OUTPATIENT_CLINIC_OR_DEPARTMENT_OTHER): Payer: Self-pay

## 2021-03-28 ENCOUNTER — Ambulatory Visit: Payer: 59

## 2021-04-11 ENCOUNTER — Other Ambulatory Visit: Payer: Self-pay

## 2021-04-11 ENCOUNTER — Other Ambulatory Visit (HOSPITAL_BASED_OUTPATIENT_CLINIC_OR_DEPARTMENT_OTHER): Payer: Self-pay

## 2021-04-11 ENCOUNTER — Ambulatory Visit: Payer: 59 | Attending: Internal Medicine

## 2021-04-11 DIAGNOSIS — Z23 Encounter for immunization: Secondary | ICD-10-CM

## 2021-04-11 MED ORDER — COVID-19 MRNA VACC (MODERNA) 100 MCG/0.5ML IM SUSP
INTRAMUSCULAR | 0 refills | Status: DC
  Filled 2021-04-11: qty 0.25, 1d supply, fill #0

## 2021-04-11 NOTE — Progress Notes (Signed)
   Covid-19 Vaccination Clinic  Name:  ALESSANDRA SAWDEY    MRN: [id number] DOB: 1965/12/26  04/11/2021  Ms. Palka was observed post Covid-19 immunization for 15 minutes without incident. She was provided with Vaccine Information Sheet and instruction to access the V-Safe system.   Ms. Macmullen was instructed to call 911 with any severe reactions post vaccine: Marland Kitchen Difficulty breathing  . Swelling of face and throat  . A fast heartbeat  . A bad rash all over body  . Dizziness and weakness   Immunizations Administered    Name Date Dose VIS Date Route   Moderna Covid-19 Booster Vaccine 04/11/2021  3:35 PM 0.25 mL 09/26/2020 Intramuscular   Manufacturer: Moderna   Lot: [id number]   NDC: 16384-665-99

## 2021-05-06 IMAGING — DX DG HIP (WITH OR WITHOUT PELVIS) 2-3V*R*
3 series · 3 of 3 positions shown · non-contrast
Comparison: Lumbar spine radiographs-earlier same day

CLINICAL DATA: Right hip pain.

EXAM:
DG HIP (WITH OR WITHOUT PELVIS) 2-3V RIGHT

[pelvis ap]
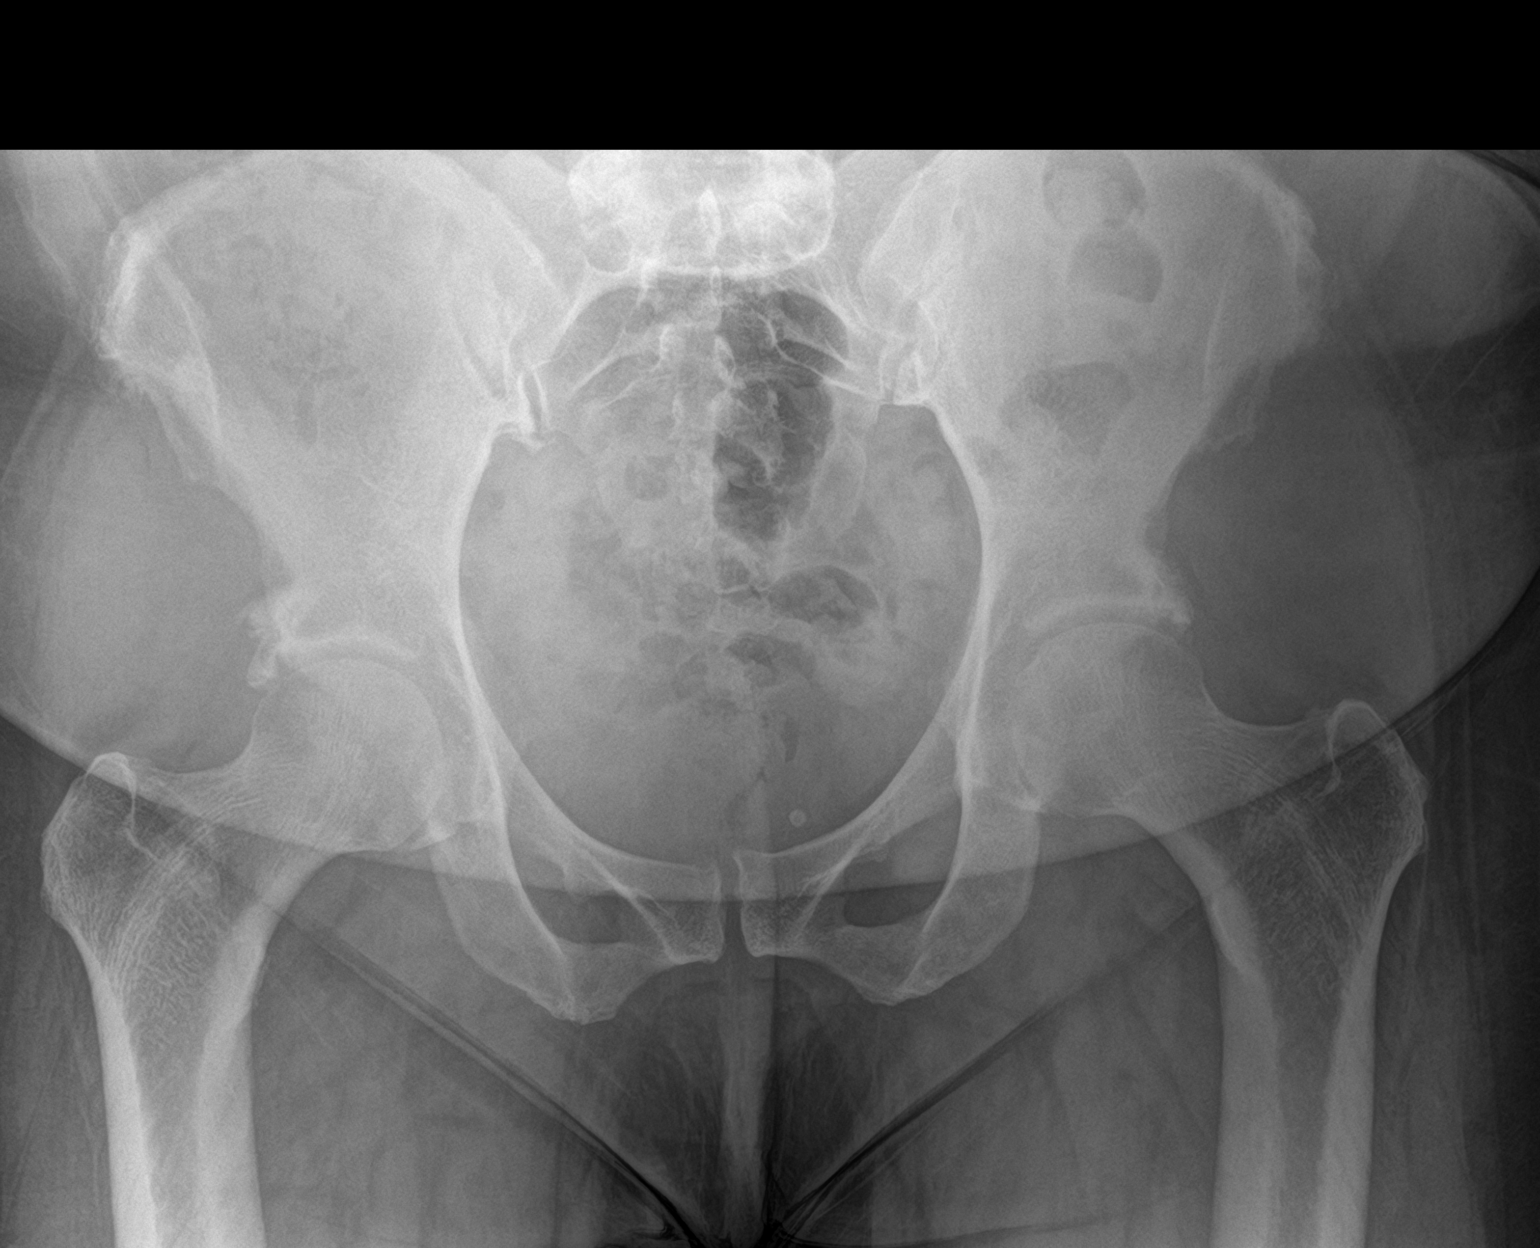

[hip ap]
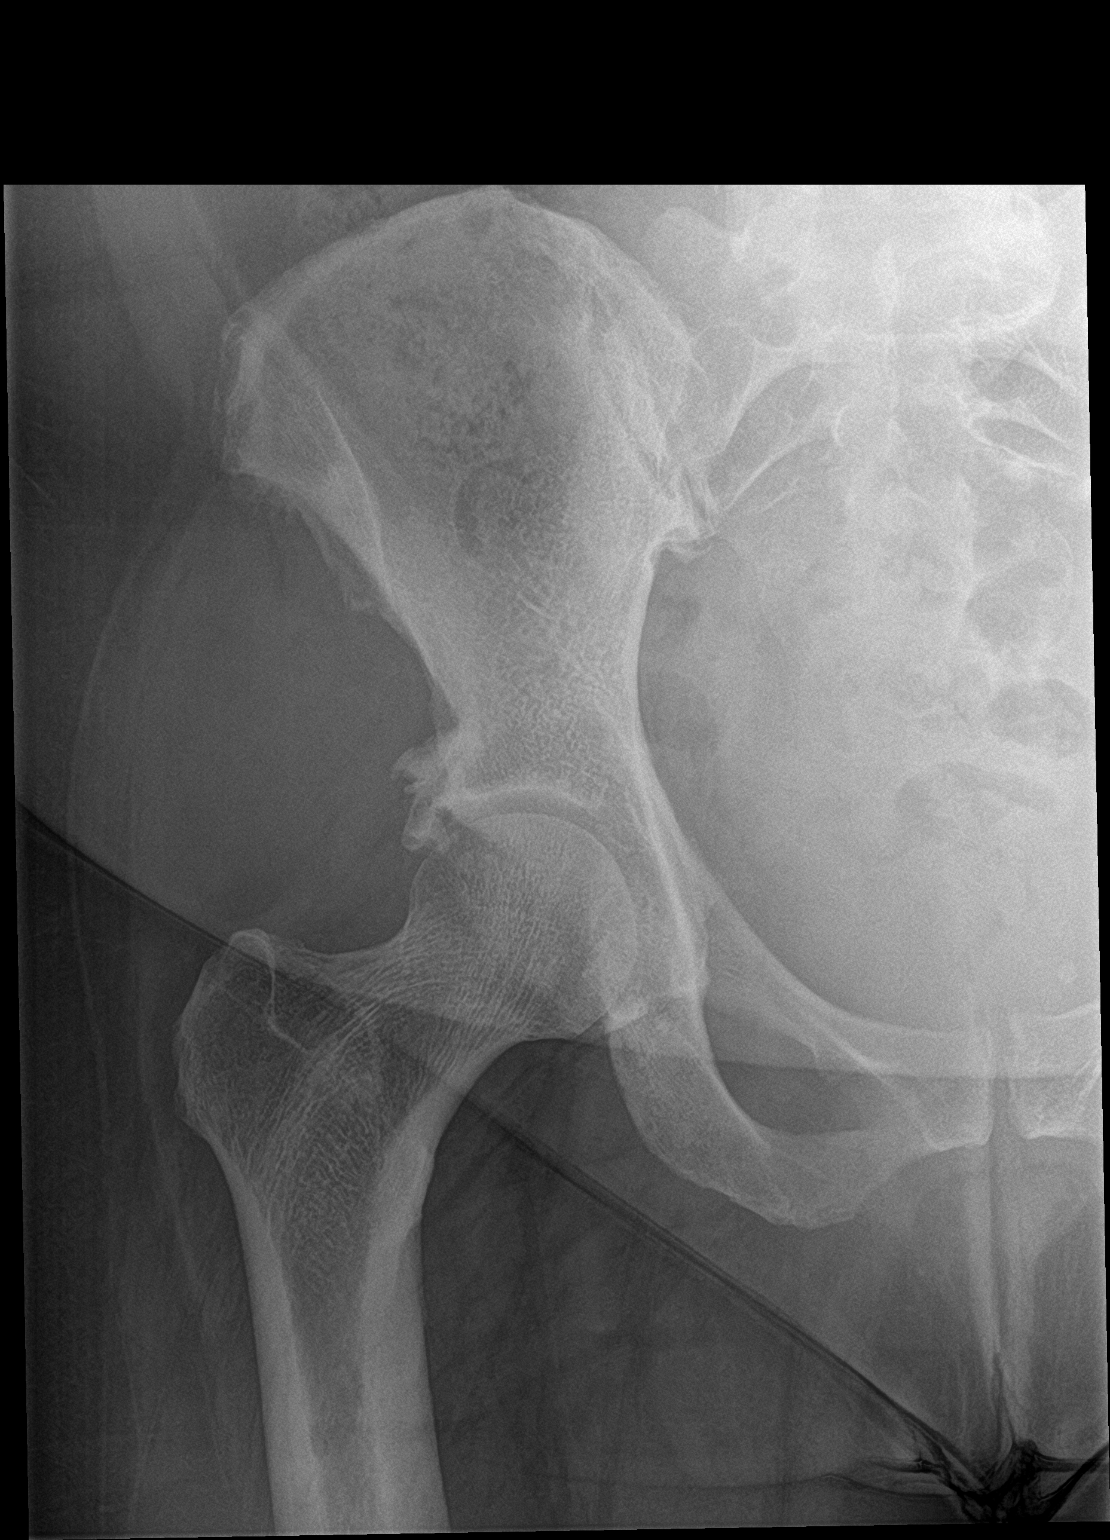

[hip frog leg]
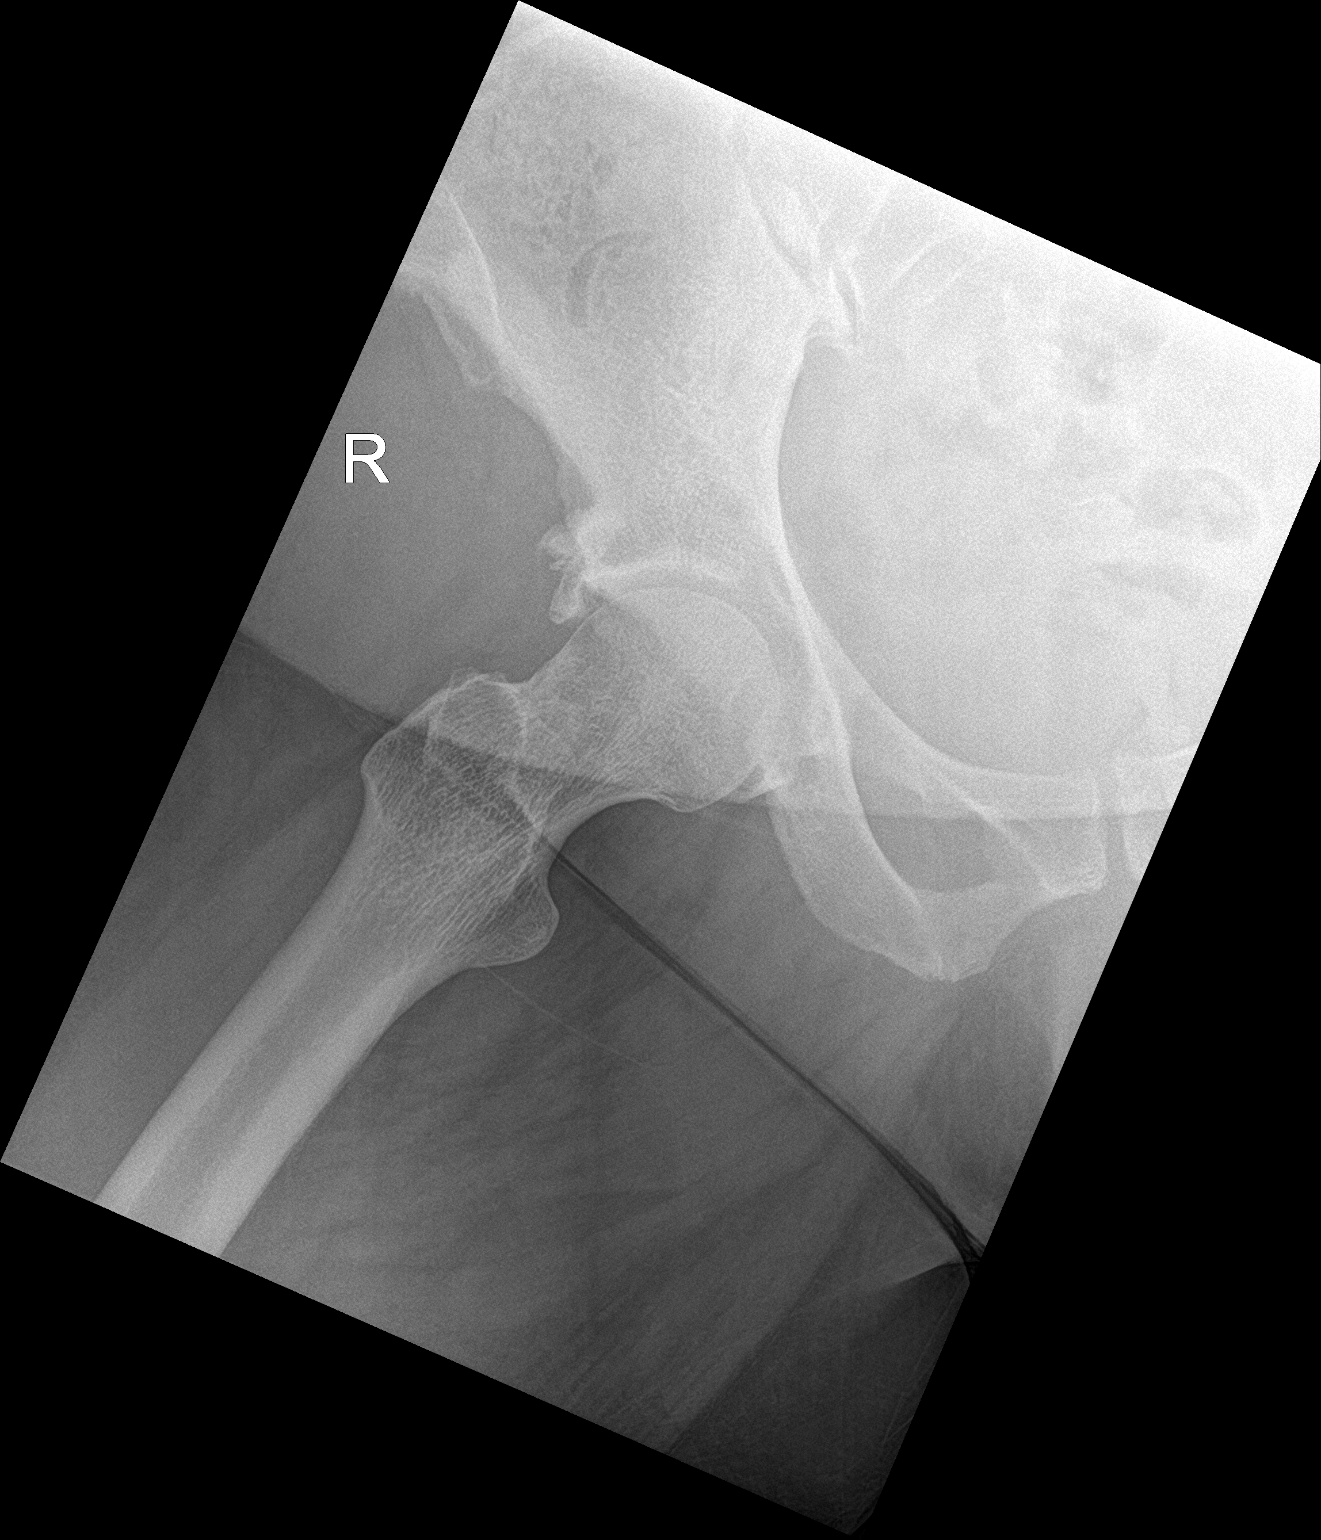

[3 of 3 positions shown; findings below may reference images not displayed]

FINDINGS: No fracture or dislocation. Moderate degenerative change of the
right hip with joint space loss, subchondral sclerosis and
osteophytosis. No evidence of avascular necrosis.

Limited visualization of the pelvis is normal. At least mild
degenerative change is suspected within the contralateral left hip,
incompletely evaluated. Degenerative change of the lower lumbar
spine is suspected though incompletely evaluated.

A punctate phlebolith overlies the left hemipelvis. Regional soft
tissues appear otherwise normal.
IMPRESSION: 1. No acute findings.
2. Moderate degenerative change of the right hip.

## 2021-05-17 ENCOUNTER — Encounter: Payer: Self-pay | Admitting: Nurse Practitioner

## 2021-05-17 ENCOUNTER — Other Ambulatory Visit: Payer: Self-pay

## 2021-05-17 ENCOUNTER — Ambulatory Visit: Payer: 59 | Admitting: Nurse Practitioner

## 2021-05-17 VITALS — BP 125/82 | HR 78 | Temp 97.9°F | Ht 65.5 in | Wt 230.0 lb

## 2021-05-17 DIAGNOSIS — Z23 Encounter for immunization: Secondary | ICD-10-CM

## 2021-05-17 DIAGNOSIS — I10 Essential (primary) hypertension: Secondary | ICD-10-CM | POA: Diagnosis not present

## 2021-05-17 MED ORDER — ACYCLOVIR 400 MG PO TABS: ORAL_TABLET | ORAL | 2 refills | Status: DC

## 2021-05-17 MED ORDER — LISINOPRIL 10 MG PO TABS: 10.0000 mg | ORAL_TABLET | Freq: Every day | ORAL | 1 refills | Status: DC

## 2021-05-17 NOTE — Progress Notes (Signed)
   Subjective:    Patient ID: Sharon Bray, female    DOB: Mar 22, 1966, 55 y.o.   MRN: [id number]  Hypertension Treatments tried: hydrochlorothiazide, lisinopril.   Prediabetes follow up on metformin  Presents for routine follow-up on her blood pressure and prediabetes.  BP outside the office is running 120/130 over less than 90.  Blood sugar is 100-110 fasting.  Patient is interested in looking at options to help her with weight loss.  Her daughter has been taking Ozempic and patient would like to try something similar.  Denies any personal history of pancreatitis or cancer.  No family history of endocrine cancers.  Had her gynecological exam in February.  Takes pantoprazole 2-3 times per week which controls her acid reflux.  Mainly notices it when she eats late at nighttime. No chest pain/ischemic type pain unusual shortness of breath or edema. Depression screen [facility] 2/9 05/17/2021 11/19/2020 11/10/2017  Decreased Interest 0 0 0  Down, Depressed, Hopeless 0 0 0  PHQ - 2 Score 0 0 0         Objective:   Physical Exam NAD.  Alert, oriented.  Calm cheerful affect.  Lungs clear.  Heart regular rate and rhythm.  Lower extremities no edema. Today's Vitals   05/17/21 0908  BP: 125/82  Pulse: 78  Temp: 97.9 F (36.6 C)  SpO2: 98%  Weight: 230 lb (104.3 kg)  Height: 5' 5.5" (1.664 m)   Body mass index is 37.69 kg/m.        Assessment & Plan:   Problem List Items Addressed This Visit       Cardiovascular and Mediastinum   Essential hypertension, benign - Primary   Relevant Medications   lisinopril (ZESTRIL) 10 MG tablet     Other   Morbid obesity (HCC)   Other Visit Diagnoses     Encounter for immunization       Relevant Orders   Pneumococcal polysaccharide vaccine 23-valent greater than or equal to 2yo subcutaneous/IM (Completed)      Meds ordered this encounter  Medications   acyclovir (ZOVIRAX) 400 MG tablet    Sig: TAKE 1 TABLET BY MOUTH 3 TIMES A DAY FOR 5 DAYS  PRN    Dispense:  15 tablet    Refill:  2   lisinopril (ZESTRIL) 10 MG tablet    Sig: Take 1 tablet (10 mg total) by mouth daily.    Dispense:  90 tablet    Refill:  1   Patient will check with insurance to see about coverage of a GLP-1. Also given information on weight loss management clinics in [place] as an option. Recommend continued weight loss efforts and increase activity. Return in about 6 months (around 11/16/2021).

## 2021-05-17 NOTE — Patient Instructions (Addendum)
[facility] in [facility] Provider Lee Vining

## 2021-05-20 ENCOUNTER — Ambulatory Visit: Payer: 59 | Admitting: Family Medicine

## 2021-05-24 ENCOUNTER — Other Ambulatory Visit: Payer: Self-pay | Admitting: Family Medicine

## 2021-05-30 LAB — HM DIABETES EYE EXAM

## 2021-06-27 ENCOUNTER — Other Ambulatory Visit: Payer: Self-pay | Admitting: Family Medicine

## 2021-06-27 DIAGNOSIS — I10 Essential (primary) hypertension: Secondary | ICD-10-CM

## 2021-06-27 DIAGNOSIS — R7303 Prediabetes: Secondary | ICD-10-CM

## 2021-07-29 ENCOUNTER — Other Ambulatory Visit: Payer: Self-pay | Admitting: Family Medicine

## 2021-07-29 DIAGNOSIS — R7303 Prediabetes: Secondary | ICD-10-CM

## 2021-07-29 DIAGNOSIS — I10 Essential (primary) hypertension: Secondary | ICD-10-CM

## 2021-08-26 ENCOUNTER — Ambulatory Visit: Payer: 59

## 2021-08-31 ENCOUNTER — Other Ambulatory Visit: Payer: Self-pay | Admitting: Family Medicine

## 2021-09-29 ENCOUNTER — Other Ambulatory Visit: Payer: Self-pay | Admitting: Nurse Practitioner

## 2021-10-05 ENCOUNTER — Other Ambulatory Visit: Payer: Self-pay | Admitting: Family Medicine

## 2021-10-05 DIAGNOSIS — R7303 Prediabetes: Secondary | ICD-10-CM

## 2021-10-05 DIAGNOSIS — I10 Essential (primary) hypertension: Secondary | ICD-10-CM

## 2021-10-11 ENCOUNTER — Encounter: Payer: Self-pay | Admitting: Nurse Practitioner

## 2021-11-24 ENCOUNTER — Other Ambulatory Visit: Payer: Self-pay | Admitting: Nurse Practitioner

## 2022-01-02 ENCOUNTER — Other Ambulatory Visit: Payer: Self-pay | Admitting: Family Medicine

## 2022-01-02 DIAGNOSIS — I10 Essential (primary) hypertension: Secondary | ICD-10-CM

## 2022-01-02 DIAGNOSIS — R7303 Prediabetes: Secondary | ICD-10-CM

## 2022-01-02 NOTE — Telephone Encounter (Signed)
Sent my chart message to schedule appointment 01/02/22

## 2022-01-06 NOTE — Telephone Encounter (Signed)
Second message sent to schedule appointment 1 /30/23

## 2022-01-08 NOTE — Telephone Encounter (Signed)
FYI-patient made appointment for 3/1 not needing medication right now . She states has enough.

## 2022-01-15 ENCOUNTER — Other Ambulatory Visit: Payer: Self-pay | Admitting: Nurse Practitioner

## 2022-01-29 ENCOUNTER — Ambulatory Visit (INDEPENDENT_AMBULATORY_CARE_PROVIDER_SITE_OTHER): Payer: 59

## 2022-01-29 ENCOUNTER — Other Ambulatory Visit: Payer: Self-pay

## 2022-01-29 ENCOUNTER — Ambulatory Visit: Payer: 59 | Admitting: Podiatry

## 2022-01-29 ENCOUNTER — Encounter: Payer: Self-pay | Admitting: Podiatry

## 2022-01-29 DIAGNOSIS — M778 Other enthesopathies, not elsewhere classified: Secondary | ICD-10-CM | POA: Diagnosis not present

## 2022-01-29 DIAGNOSIS — M722 Plantar fascial fibromatosis: Secondary | ICD-10-CM

## 2022-01-29 MED ORDER — TRIAMCINOLONE ACETONIDE 10 MG/ML IJ SUSP
10.0000 mg | Freq: Once | INTRAMUSCULAR | Status: AC
  Administered 2022-01-29: 10 mg

## 2022-01-29 MED ORDER — DICLOFENAC SODIUM 75 MG PO TBEC: 75.0000 mg | DELAYED_RELEASE_TABLET | Freq: Two times a day (BID) | ORAL | 2 refills | Status: AC

## 2022-01-29 NOTE — Progress Notes (Signed)
Subjective:   Patient ID: Sharon Bray, female   DOB: 56 y.o.   MRN: [id number]   HPI Patient states she started develop exquisite discomfort on top of her right foot and its been throbbing and she could not sleep.  Also the heel started to hurt but only to a mild degree   ROS      Objective:  Physical Exam  Neurovascular status intact exquisite discomfort dorsal right foot in the tarsal metatarsal joint with no indication of metatarsal shaft involvement with mild plantar heel pain     Assessment:  Probability for acute inflammation of the dorsal tendon complex extensor around the tarsal bones with no distal pain and mild fasciitis     Plan:  Precautionary x-ray reviewed today I recommended sterile injection due to the intense discomfort explaining risk and I did sterile prep and injected the extensor tendon 3 mg dexamethasone Kenalog 5 mg Xylocaine advised on ice reduced activity placed on oral diclofenac reappoint as indicated  X-rays indicate that there is no indication stress fracture 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

## 2022-02-05 ENCOUNTER — Other Ambulatory Visit: Payer: Self-pay

## 2022-02-05 ENCOUNTER — Ambulatory Visit: Payer: 59 | Admitting: Family Medicine

## 2022-02-05 DIAGNOSIS — E669 Obesity, unspecified: Secondary | ICD-10-CM

## 2022-02-05 DIAGNOSIS — R7303 Prediabetes: Secondary | ICD-10-CM

## 2022-02-05 DIAGNOSIS — I10 Essential (primary) hypertension: Secondary | ICD-10-CM | POA: Diagnosis not present

## 2022-02-05 MED ORDER — LISINOPRIL 10 MG PO TABS: 10.0000 mg | ORAL_TABLET | Freq: Every day | ORAL | 3 refills | Status: DC

## 2022-02-05 MED ORDER — METFORMIN HCL 500 MG PO TABS: 500.0000 mg | ORAL_TABLET | Freq: Two times a day (BID) | ORAL | 3 refills | Status: DC

## 2022-02-05 MED ORDER — HYDROCHLOROTHIAZIDE 25 MG PO TABS: 25.0000 mg | ORAL_TABLET | Freq: Every day | ORAL | 3 refills | Status: DC

## 2022-02-05 MED ORDER — ACYCLOVIR 400 MG PO TABS: ORAL_TABLET | ORAL | 2 refills | Status: AC

## 2022-02-05 NOTE — Patient Instructions (Signed)
I have refilled your medications. ° °Follow up in 6 months. ° °Take care ° °Dr. Sharnice Bosler  °

## 2022-02-05 NOTE — Assessment & Plan Note (Signed)
Stable.  Continue lisinopril and HCTZ.  Refilled today. 

## 2022-02-05 NOTE — Progress Notes (Signed)
? ?Subjective:  ?Patient ID: Sharon Bray, female    DOB: 02/07/1966  Age: 56 y.o. MRN: [id number] ? ?CC: ?Chief Complaint  ?Patient presents with  ? Establish Care  ? Medication Refill  ?  Needs refill on Zovirax, Voltaren, Metformin, Hydrochlorothiazide and Linisopril  ? ? ?HPI: ? ?56 year old female with prediabetes, obesity, vitamin D deficiency, hypertriglyceridemia, GERD, hypertension presents for follow-up/medication refill. ? ?Patient has lost a substantial amount of weight since her last visit here.  Patient states that she has had relatively recent labs done by weight loss clinic.  She states that she will bring these back into the office. ? ?Patient states that she needs medication refills.  She has prediabetes and is currently on metformin.  Doing well.  No side effects. ? ?BP stable on lisinopril and HCTZ. ? ?Patient Active Problem List  ? Diagnosis Date Noted  ? Prediabetes 02/05/2022  ? Obesity (BMI 30-39.9) 02/05/2022  ? Gastroesophageal reflux disease without esophagitis 10/18/2016  ? Essential hypertension, benign 05/09/2016  ? Vitamin D deficiency 04/06/2015  ? Hypertriglyceridemia 03/28/2013  ? ? ?Social Hx   ?Social History  ? ?Socioeconomic History  ? Marital status: Single  ?  Spouse name: Not on file  ? Number of children: Not on file  ? Years of education: Not on file  ? Highest education level: Not on file  ?Occupational History  ? Not on file  ?Tobacco Use  ? Smoking status: Never  ? Smokeless tobacco: Never  ?Substance and Sexual Activity  ? Alcohol use: No  ?  Alcohol/week: 0.0 standard drinks  ? Drug use: No  ? Sexual activity: Yes  ?  Birth control/protection: Surgical  ?Other Topics Concern  ? Not on file  ?Social History Narrative  ? Not on file  ? ?Social Determinants of Health  ? ?Financial Resource Strain: Not on file  ?Food Insecurity: Not on file  ?Transportation Needs: Not on file  ?Physical Activity: Not on file  ?Stress: Not on file  ?Social Connections: Not on file   ? ? ?Review of Systems  ?Constitutional: Negative.   ?Respiratory: Negative.    ?Cardiovascular: Negative.   ? ? ?Objective:  ?BP 140/70   Pulse 90   Temp 97.6 ?F (36.4 ?C) (Oral)   Ht 5' 5.5" (1.664 m)   Wt 190 lb 3.2 oz (86.3 kg)   SpO2 100%   BMI 31.17 kg/m?  ? ?BP/Weight 02/05/2022 05/17/2021 12/14/2020  ?Systolic BP 140 125 128  ?Diastolic BP 70 82 88  ?Wt. (Lbs) 190.2 230 228  ?BMI 31.17 37.69 37.36  ? ? ?Physical Exam ?Vitals and nursing note reviewed.  ?Constitutional:   ?   General: She is not in acute distress. ?   Appearance: Normal appearance.  ?HENT:  ?   Head: Normocephalic and atraumatic.  ?Cardiovascular:  ?   Rate and Rhythm: Normal rate and regular rhythm.  ?Pulmonary:  ?   Breath sounds: Normal breath sounds. No wheezing, rhonchi or rales.  ?Neurological:  ?   Mental Status: She is alert.  ?Psychiatric:     ?   Mood and Affect: Mood normal.     ?   Behavior: Behavior normal.  ? ? ?Lab Results  ?Component Value Date  ? WBC 4.2 11/19/2020  ? HGB 13.2 11/19/2020  ? HCT 39.1 11/19/2020  ? PLT 258 11/19/2020  ? GLUCOSE 100 (H) 11/19/2020  ? CHOL 156 11/19/2020  ? TRIG 157 (H) 11/19/2020  ? HDL 38 (L) 11/19/2020  ?  LDLCALC 91 11/19/2020  ? ALT 18 11/19/2020  ? AST 17 11/19/2020  ? NA 141 11/19/2020  ? K 3.7 11/19/2020  ? CL 102 11/19/2020  ? CREATININE 1.07 (H) 11/19/2020  ? BUN 18 11/19/2020  ? CO2 22 11/19/2020  ? TSH 1.300 05/27/2018  ? HGBA1C 6.1 (H) 11/19/2020  ? ? ? ?Assessment & Plan:  ? ?Problem List Items Addressed This Visit   ? ?  ? Cardiovascular and Mediastinum  ? Essential hypertension, benign  ?  Stable.  Continue lisinopril and HCTZ.  Refilled today. ?  ?  ? Relevant Medications  ? hydrochlorothiazide (HYDRODIURIL) 25 MG tablet  ? lisinopril (ZESTRIL) 10 MG tablet  ? metFORMIN (GLUCOPHAGE) 500 MG tablet  ?  ? Other  ? Prediabetes  ?  Will review labs once patient returns them.  May be able to decrease metformin or discontinue given patient's weight loss.  Continue for now. ?  ?  ?  Relevant Medications  ? metFORMIN (GLUCOPHAGE) 500 MG tablet  ? Obesity (BMI 30-39.9)  ? Relevant Medications  ? metFORMIN (GLUCOPHAGE) 500 MG tablet  ? ? ?Meds ordered this encounter  ?Medications  ? acyclovir (ZOVIRAX) 400 MG tablet  ?  Sig: TAKE 1 TABLET BY MOUTH 3 TIMES A DAY FOR 5 DAYS PRN - For cold sore  ?  Dispense:  15 tablet  ?  Refill:  2  ? hydrochlorothiazide (HYDRODIURIL) 25 MG tablet  ?  Sig: Take 1 tablet (25 mg total) by mouth daily.  ?  Dispense:  90 tablet  ?  Refill:  3  ? lisinopril (ZESTRIL) 10 MG tablet  ?  Sig: Take 1 tablet (10 mg total) by mouth daily.  ?  Dispense:  90 tablet  ?  Refill:  3  ? metFORMIN (GLUCOPHAGE) 500 MG tablet  ?  Sig: Take 1 tablet (500 mg total) by mouth 2 (two) times daily with a meal.  ?  Dispense:  180 tablet  ?  Refill:  3  ? ? ?Follow-up:  6 months. ? ?Everlene Other DO ?[facility] ? ?

## 2022-02-05 NOTE — Assessment & Plan Note (Signed)
Will review labs once patient returns them.  May be able to decrease metformin or discontinue given patient's weight loss.  Continue for now. ?

## 2022-04-13 ENCOUNTER — Other Ambulatory Visit: Payer: Self-pay | Admitting: Family Medicine

## 2022-04-13 DIAGNOSIS — R7303 Prediabetes: Secondary | ICD-10-CM

## 2022-04-13 DIAGNOSIS — I10 Essential (primary) hypertension: Secondary | ICD-10-CM

## 2022-07-25 ENCOUNTER — Encounter: Payer: Self-pay | Admitting: Nurse Practitioner

## 2022-07-25 ENCOUNTER — Ambulatory Visit: Payer: 59 | Admitting: Nurse Practitioner

## 2022-07-25 VITALS — BP 110/70 | HR 81 | Temp 98.2°F | Ht 65.5 in | Wt 180.0 lb

## 2022-07-25 DIAGNOSIS — D72819 Decreased white blood cell count, unspecified: Secondary | ICD-10-CM

## 2022-07-25 DIAGNOSIS — R7303 Prediabetes: Secondary | ICD-10-CM | POA: Diagnosis not present

## 2022-07-25 DIAGNOSIS — N760 Acute vaginitis: Secondary | ICD-10-CM

## 2022-07-25 DIAGNOSIS — E781 Pure hyperglyceridemia: Secondary | ICD-10-CM

## 2022-07-25 DIAGNOSIS — B9689 Other specified bacterial agents as the cause of diseases classified elsewhere: Secondary | ICD-10-CM

## 2022-07-25 DIAGNOSIS — I10 Essential (primary) hypertension: Secondary | ICD-10-CM

## 2022-07-25 MED ORDER — METRONIDAZOLE 500 MG PO TABS: 500.0000 mg | ORAL_TABLET | Freq: Two times a day (BID) | ORAL | 0 refills | Status: DC

## 2022-07-25 NOTE — Progress Notes (Signed)
Subjective:    Patient ID: Sharon Bray, female    DOB: [birth year], 56 y.o.   MRN: [id number]  HPI Nurses Note: Change in [facility] were noted by her weight management provider Patient has lab results in hand Would like to come off metformin due to being on trulicity  Vaginal discomfort reported  States her white blood cell count was slightly low and has been advised to see her primary care provider for further follow-up.  No recent illness that would account for the drop in her WBCs. Gets monthly follow-up at [facility] weight loss.  Currently on Trulicity, will be increasing to 3 mg dose soon.  Denies any nausea vomiting abdominal pain or other adverse effects.  States her blood sugars have been running in the 80s and 90s at home, would like to do a trial of discontinuing her metformin.  Doing good with her diet, tries to avoid excessive amounts of bread or simple carbs.  Activity has been limited due to the extreme heat, states she tries to walk daily for her activity.  No overt reflux symptoms. Has similar symptoms when she had her GYN visit back in February, was diagnosed with bacterial vaginosis at that time. Some recurrent problems with BV.  Has had a small amount of discharge, minimal odor only when she wipes.  Mild discomfort at the vulvar area and also with urination. No pelvic pain.  No urinary symptoms otherwise.  Is not sexually active, no new sexual partners. Gets yearly GYN exams with gynecology.  Review of Systems  Constitutional:  Positive for activity change.       Less walking due to the heat  Respiratory:  Negative for chest tightness and shortness of breath.   Cardiovascular:  Negative for chest pain.  Gastrointestinal:  Negative for abdominal pain, constipation, diarrhea, nausea and vomiting.  Genitourinary:  Positive for dysuria and vaginal discharge. Negative for frequency, pelvic pain and urgency.       Mild external discomfort with urination       Objective:    Physical Exam NAD.  Alert, oriented.  Lungs clear.  Heart regular rate rhythm.  See scanned lab report.  WBC count 3.3 on 06/11/2022.  Patient tends to run low normal WBC count.  Her count was 4.2 a year ago and 3.5 13 years ago. Today's Vitals   07/25/22 1507  BP: 110/70  Pulse: 81  Temp: 98.2 F (36.8 C)  SpO2: 100%  Weight: 180 lb (81.6 kg)  Height: 5' 5.5" (1.664 m)   Body mass index is 29.5 kg/m.        Assessment & Plan:   Problem List Items Addressed This Visit       Cardiovascular and Mediastinum   Essential hypertension, benign     Other   Hypertriglyceridemia   Prediabetes - Primary   Other Visit Diagnoses     Leukopenia, unspecified type       Relevant Orders   CBC with Differential/Platelet   Bacterial vaginosis       Relevant Medications   metroNIDAZOLE (FLAGYL) 500 MG tablet        Meds ordered this encounter  Medications   metroNIDAZOLE (FLAGYL) 500 MG tablet    Sig: Take 1 tablet (500 mg total) by mouth 2 (two) times daily with a meal.    Dispense:  14 tablet    Refill:  0    Order Specific Question:   Supervising Provider    Answer:   Lilyan Punt  A [[id number]]   Hold on metformin when she increases her Trulicity to 3 mg.  Continue to monitor sugars and if they remain in the same range, she can discontinue this. Repeat CBC in early September to recheck her WBC count. Continue follow-up with [facility] medical and continue healthy lifestyle habits. Recommend recheck at our office or gynecology if discharge/vaginitis continues. Return in about 6 months (around 01/25/2023).

## 2022-07-25 NOTE — Patient Instructions (Signed)

## 2022-08-19 LAB — CBC WITH DIFFERENTIAL/PLATELET
Basophils Absolute: 0 10*3/uL (ref 0.0–0.2)
Basos: 1 %
EOS (ABSOLUTE): 0.1 10*3/uL (ref 0.0–0.4)
Eos: 2 %
Hematocrit: 36.3 % (ref 34.0–46.6)
Hemoglobin: 12.2 g/dL (ref 11.1–15.9)
Immature Grans (Abs): 0 10*3/uL (ref 0.0–0.1)
Immature Granulocytes: 0 %
Lymphocytes Absolute: 1.4 10*3/uL (ref 0.7–3.1)
Lymphs: 34 %
MCH: 29.1 pg (ref 26.6–33.0)
MCHC: 33.6 g/dL (ref 31.5–35.7)
MCV: 87 fL (ref 79–97)
Monocytes Absolute: 0.3 10*3/uL (ref 0.1–0.9)
Monocytes: 7 %
Neutrophils Absolute: 2.3 10*3/uL (ref 1.4–7.0)
Neutrophils: 56 %
Platelets: 250 10*3/uL (ref 150–450)
RBC: 4.19 x10E6/uL (ref 3.77–5.28)
RDW: 13.3 % (ref 11.7–15.4)
WBC: 4 10*3/uL (ref 3.4–10.8)

## 2023-02-02 LAB — LAB REPORT - SCANNED
A1c: 5.5
EGFR: 69

## 2023-02-10 ENCOUNTER — Other Ambulatory Visit: Payer: Self-pay | Admitting: Family Medicine

## 2023-02-10 DIAGNOSIS — Z1231 Encounter for screening mammogram for malignant neoplasm of breast: Secondary | ICD-10-CM

## 2023-04-10 ENCOUNTER — Ambulatory Visit: Payer: 59 | Admitting: Nurse Practitioner

## 2023-04-17 ENCOUNTER — Ambulatory Visit: Payer: 59 | Admitting: Nurse Practitioner

## 2023-04-17 ENCOUNTER — Encounter: Payer: Self-pay | Admitting: Nurse Practitioner

## 2023-04-17 VITALS — BP 123/79 | HR 81 | Temp 98.4°F | Ht 65.0 in | Wt 184.0 lb

## 2023-04-17 DIAGNOSIS — E876 Hypokalemia: Secondary | ICD-10-CM | POA: Diagnosis not present

## 2023-04-17 DIAGNOSIS — I10 Essential (primary) hypertension: Secondary | ICD-10-CM | POA: Diagnosis not present

## 2023-04-17 DIAGNOSIS — E669 Obesity, unspecified: Secondary | ICD-10-CM

## 2023-04-17 MED ORDER — LISINOPRIL 10 MG PO TABS: 10.0000 mg | ORAL_TABLET | Freq: Every day | ORAL | 1 refills | Status: DC

## 2023-04-17 MED ORDER — HYDROCHLOROTHIAZIDE 25 MG PO TABS: 25.0000 mg | ORAL_TABLET | Freq: Every day | ORAL | 1 refills | Status: DC

## 2023-04-17 MED ORDER — POTASSIUM CHLORIDE ER 10 MEQ PO TBCR: 10.0000 meq | EXTENDED_RELEASE_TABLET | Freq: Every day | ORAL | 1 refills | Status: DC

## 2023-04-17 NOTE — Patient Instructions (Signed)
Mounjaro (diabetes) and Zepbound (weight loss)

## 2023-04-17 NOTE — Progress Notes (Unsigned)
Subjective:    Patient ID: Sharon Bray, female    DOB: Nov 12, 1966, 57 y.o.   MRN: [id number]  HPI Patient presents for routine follow-up of her hypertension.  Is being followed by a weight loss clinic which has prescribed phentermine and Trulicity.  Stopped the phentermine recently.  Her weight loss has plateaued.  Has been on medications long-term..  Doing well with her diet.  When she is at work she walks 18000-20000 steps per day.  GERD has been stable on pantoprazole.  Has noticed more constipation lately.  Had pain in the left lateral mid abdominal area about 2 years ago.  This had completely resolved but started back about 2 months ago.  Has noticed constipation.  No blood in her stool.  No fevers.  Pain mainly occurs when she is bloated and has gas.  That seems to be the major trigger.  Does not occur all the time, only with bloating and gas.  Was scheduled for a CT scan of the abdomen January 2022 but insurance would not approve it.  Patient held off since symptoms had resolved. Gets regular preventive health physicals and mammogram through gynecology.  Has brought her recent lab work with her today which will be scanned into her chart.   Review of Systems  Constitutional:  Positive for fatigue.  HENT:  Negative for sore throat and trouble swallowing.   Respiratory:  Negative for cough, chest tightness, shortness of breath and wheezing.   Gastrointestinal:  Positive for abdominal distention, abdominal pain and constipation. Negative for blood in stool and diarrhea.       Intermittent abdominal distention/bloating with gas at times.       Objective:   Physical Exam NAD.  Alert, oriented.  Thyroid nontender to palpation, no mass or goiter noted.  Lungs clear.  Heart regular rate rhythm.  Abdomen soft nondistended with active bowel sounds x 4; no tenderness or obvious masses noted. Today's Vitals   04/17/23 1436  BP: 123/79  Pulse: 81  Temp: 98.4 F (36.9 C)  SpO2: 99%  Weight:  184 lb (83.5 kg)  Height: 5\' 5"  (1.651 m)   Body mass index is 30.62 kg/m. See scanned lab report.  Note patient's serum potassium level was 3.5 on 02/02/2023.       Assessment & Plan:   Problem List Items Addressed This Visit       Cardiovascular and Mediastinum   Essential hypertension, benign - Primary   Relevant Medications   hydrochlorothiazide (HYDRODIURIL) 25 MG tablet   lisinopril (ZESTRIL) 10 MG tablet     Other   Hypokalemia   Relevant Medications   potassium chloride (KLOR-CON) 10 MEQ tablet   Obesity (BMI 30-39.9)   Meds ordered this encounter  Medications   hydrochlorothiazide (HYDRODIURIL) 25 MG tablet    Sig: Take 1 tablet (25 mg total) by mouth daily.    Dispense:  90 tablet    Refill:  1    Order Specific Question:   Supervising Provider    Answer:   Lilyan Punt A [[id number]]   lisinopril (ZESTRIL) 10 MG tablet    Sig: Take 1 tablet (10 mg total) by mouth daily.    Dispense:  90 tablet    Refill:  1    Order Specific Question:   Supervising Provider    Answer:   Lilyan Punt A [[id number]]   potassium chloride (KLOR-CON) 10 MEQ tablet    Sig: Take 1 tablet (10 mEq total) by mouth daily.  Dispense:  90 tablet    Refill:  1    Order Specific Question:   Supervising Provider    Answer:   Lilyan Punt A [id number]   Continue follow-up with weight loss clinic as directed.  Recommend patient consider Zepbound as an option.  Recommend holding on phentermine since this is not helping her weight loss. Discussed how Trulicity could affect her bowels as a side effect.  Also this could be from healthy high-fiber diet.  Warning signs reviewed regarding her abdominal pain.  Call back if worsens or persists.  Defers Tdap vaccine today.  Recommend getting this at local pharmacy especially if she has any dirty wound or burn. Return in about 6 months (around 10/18/2023).

## 2023-04-19 ENCOUNTER — Encounter: Payer: Self-pay | Admitting: Nurse Practitioner

## 2023-06-18 ENCOUNTER — Encounter (INDEPENDENT_AMBULATORY_CARE_PROVIDER_SITE_OTHER): Payer: 59 | Admitting: Physician Assistant

## 2023-12-29 ENCOUNTER — Ambulatory Visit: Payer: 59 | Admitting: Nurse Practitioner

## 2023-12-29 VITALS — BP 167/97 | HR 77 | Ht 65.0 in | Wt 212.0 lb

## 2023-12-29 DIAGNOSIS — R7303 Prediabetes: Secondary | ICD-10-CM | POA: Diagnosis not present

## 2023-12-29 DIAGNOSIS — E781 Pure hyperglyceridemia: Secondary | ICD-10-CM | POA: Diagnosis not present

## 2023-12-29 DIAGNOSIS — I10 Essential (primary) hypertension: Secondary | ICD-10-CM | POA: Diagnosis not present

## 2023-12-29 DIAGNOSIS — E876 Hypokalemia: Secondary | ICD-10-CM | POA: Diagnosis not present

## 2023-12-29 DIAGNOSIS — R5383 Other fatigue: Secondary | ICD-10-CM

## 2023-12-29 DIAGNOSIS — R635 Abnormal weight gain: Secondary | ICD-10-CM

## 2023-12-29 MED ORDER — LISINOPRIL 10 MG PO TABS: 10.0000 mg | ORAL_TABLET | Freq: Every day | ORAL | 1 refills | Status: DC

## 2023-12-29 MED ORDER — HYDROCHLOROTHIAZIDE 25 MG PO TABS: 25.0000 mg | ORAL_TABLET | Freq: Every day | ORAL | 1 refills | Status: DC

## 2023-12-29 MED ORDER — POTASSIUM CHLORIDE ER 10 MEQ PO TBCR: 10.0000 meq | EXTENDED_RELEASE_TABLET | Freq: Every day | ORAL | 1 refills | Status: DC

## 2024-01-01 ENCOUNTER — Encounter: Payer: Self-pay | Admitting: Nurse Practitioner

## 2024-01-01 NOTE — Progress Notes (Signed)
Subjective:    Patient ID: Sharon Bray, female    DOB: 1966-11-26, 58 y.o.   MRN: [id number]  HPI Presents for routine follow-up for blood pressure.  Has been seen at [facility] medical for weight loss and has been on phentermine for over a year.  Since coming off medication she has gained back over 20 pounds.  Still eats mid to bread and cereal.  Low-sodium diet.  No soft drinks.  States her main issue is frequent snacking.  Recent BP outside of our office was 163/100.  No orthopnea. Limited activity for the past 2 months mainly due to the cold weather. Gets regular preventive health physicals with gynecology.   Review of Systems  Constitutional:  Positive for fatigue.  HENT:  Negative for sore throat and trouble swallowing.   Respiratory:  Negative for cough, chest tightness, shortness of breath and wheezing.   Cardiovascular:  Negative for chest pain.  Neurological:  Positive for headaches. Negative for facial asymmetry, speech difficulty, weakness and numbness.       Occasional mild frontal area headache.      12/29/2023    3:57 PM  Depression screen PHQ 2/9  Decreased Interest 0  Down, Depressed, Hopeless 0  PHQ - 2 Score 0  Altered sleeping 0  Tired, decreased energy 1  Change in appetite 0  Feeling bad or failure about yourself  0  Trouble concentrating 0  Moving slowly or fidgety/restless 0  Suicidal thoughts 0  PHQ-9 Score 1  Difficult doing work/chores Not difficult at all      12/29/2023    3:57 PM 04/17/2023    2:37 PM  GAD 7 : Generalized Anxiety Score  Nervous, Anxious, on Edge 0 0  Control/stop worrying 0 0  Worry too much - different things 0 0  Trouble relaxing 0 0  Restless 0 0  Easily annoyed or irritable 0 1  Afraid - awful might happen 0 0  Total GAD 7 Score 0 1  Anxiety Difficulty Not difficult at all Not difficult at all         Objective:   Physical Exam NAD.  Alert, oriented.  Thyroid nontender to palpation, no mass or goiter noted.   Lungs clear.  Heart regular rate rhythm.  Carotids no bruits or thrills.  No murmur or gallop noted.  Lower extremities no edema. Today's Vitals   12/29/23 1550 12/29/23 1641  BP: (!) 147/91 (!) 167/97  Pulse: 77   SpO2: 98%   Weight: 212 lb (96.2 kg)   Height: 5\' 5"  (1.651 m)    Body mass index is 35.28 kg/m.        Assessment & Plan:   Problem List Items Addressed This Visit       Cardiovascular and Mediastinum   Essential hypertension, benign - Primary   Relevant Medications   hydrochlorothiazide (HYDRODIURIL) 25 MG tablet   lisinopril (ZESTRIL) 10 MG tablet   Other Relevant Orders   Comprehensive metabolic panel     Other   Hypertriglyceridemia   Relevant Medications   hydrochlorothiazide (HYDRODIURIL) 25 MG tablet   lisinopril (ZESTRIL) 10 MG tablet   Other Relevant Orders   Lipid panel   Hypokalemia   Relevant Medications   potassium chloride (KLOR-CON) 10 MEQ tablet   Other Relevant Orders   Comprehensive metabolic panel   Prediabetes   Relevant Orders   Comprehensive metabolic panel   Hemoglobin A1c   Other Visit Diagnoses       Fatigue,  unspecified type       Relevant Orders   CBC with Differential/Platelet   Comprehensive metabolic panel   T4, free   TSH     Weight gain       Relevant Orders   T4, free   TSH      Meds ordered this encounter  Medications   hydrochlorothiazide (HYDRODIURIL) 25 MG tablet    Sig: Take 1 tablet (25 mg total) by mouth daily.    Dispense:  90 tablet    Refill:  1    Supervising Provider:   Lilyan Punt A [[id number]]   lisinopril (ZESTRIL) 10 MG tablet    Sig: Take 1 tablet (10 mg total) by mouth daily.    Dispense:  90 tablet    Refill:  1    Supervising Provider:   Lilyan Punt A [[id number]]   potassium chloride (KLOR-CON) 10 MEQ tablet    Sig: Take 1 tablet (10 mEq total) by mouth daily.    Dispense:  90 tablet    Refill:  1    Supervising Provider:   Lilyan Punt A [[id number]]   Restart previous medications for  hypertension. Encouraged weight loss of about 10 pounds and regular activity. Monitor blood pressure at home and if remains over 140/90 contact the office. Routine labs ordered. Return in about 3 months (around 03/28/2024). Call back sooner if needed.

## 2024-01-02 LAB — CBC WITH DIFFERENTIAL/PLATELET
Basophils Absolute: 0 10*3/uL (ref 0.0–0.2)
Basos: 0 %
EOS (ABSOLUTE): 0 10*3/uL (ref 0.0–0.4)
Eos: 0 %
Hematocrit: 41.5 % (ref 34.0–46.6)
Hemoglobin: 13.6 g/dL (ref 11.1–15.9)
Immature Grans (Abs): 0 10*3/uL (ref 0.0–0.1)
Immature Granulocytes: 0 %
Lymphocytes Absolute: 1.3 10*3/uL (ref 0.7–3.1)
Lymphs: 18 %
MCH: 28.4 pg (ref 26.6–33.0)
MCHC: 32.8 g/dL (ref 31.5–35.7)
MCV: 87 fL (ref 79–97)
Monocytes Absolute: 0.4 10*3/uL (ref 0.1–0.9)
Monocytes: 6 %
Neutrophils Absolute: 5.3 10*3/uL (ref 1.4–7.0)
Neutrophils: 76 %
Platelets: 263 10*3/uL (ref 150–450)
RBC: 4.79 x10E6/uL (ref 3.77–5.28)
RDW: 13.7 % (ref 11.7–15.4)
WBC: 7 10*3/uL (ref 3.4–10.8)

## 2024-01-02 LAB — COMPREHENSIVE METABOLIC PANEL
ALT: 15 [IU]/L (ref 0–32)
AST: 20 [IU]/L (ref 0–40)
Albumin: 4.4 g/dL (ref 3.8–4.9)
Alkaline Phosphatase: 105 [IU]/L (ref 44–121)
BUN/Creatinine Ratio: 22 (ref 9–23)
BUN: 27 mg/dL — ABNORMAL HIGH (ref 6–24)
Bilirubin Total: 0.5 mg/dL (ref 0.0–1.2)
CO2: 21 mmol/L (ref 20–29)
Calcium: 9.3 mg/dL (ref 8.7–10.2)
Chloride: 103 mmol/L (ref 96–106)
Creatinine, Ser: 1.23 mg/dL — ABNORMAL HIGH (ref 0.57–1.00)
Globulin, Total: 3 g/dL (ref 1.5–4.5)
Glucose: 96 mg/dL (ref 70–99)
Potassium: 4 mmol/L (ref 3.5–5.2)
Sodium: 140 mmol/L (ref 134–144)
Total Protein: 7.4 g/dL (ref 6.0–8.5)
eGFR: 51 mL/min/{1.73_m2} — ABNORMAL LOW (ref >59)

## 2024-01-02 LAB — LIPID PANEL
Chol/HDL Ratio: 3.5 {ratio} (ref 0.0–4.4)
Cholesterol, Total: 164 mg/dL (ref 100–199)
HDL: 47 mg/dL (ref >39)
LDL Chol Calc (NIH): 98 mg/dL (ref 0–99)
Triglycerides: 106 mg/dL (ref 0–149)
VLDL Cholesterol Cal: 19 mg/dL (ref 5–40)

## 2024-01-02 LAB — T4, FREE: Free T4: 0.97 ng/dL (ref 0.82–1.77)

## 2024-01-02 LAB — HEMOGLOBIN A1C
Est. average glucose Bld gHb Est-mCnc: 120 mg/dL
Hgb A1c MFr Bld: 5.8 % — ABNORMAL HIGH (ref 4.8–5.6)

## 2024-01-02 LAB — TSH: TSH: 0.597 u[IU]/mL (ref 0.450–4.500)

## 2024-01-05 ENCOUNTER — Encounter: Payer: Self-pay | Admitting: Nurse Practitioner

## 2024-01-08 ENCOUNTER — Ambulatory Visit: Payer: 59 | Admitting: Nurse Practitioner

## 2024-01-08 VITALS — BP 110/72 | HR 72 | Temp 97.7°F | Ht 65.0 in | Wt 204.6 lb

## 2024-01-08 DIAGNOSIS — R300 Dysuria: Secondary | ICD-10-CM

## 2024-01-08 DIAGNOSIS — A084 Viral intestinal infection, unspecified: Secondary | ICD-10-CM

## 2024-01-08 LAB — POCT URINALYSIS DIP (CLINITEK)
Bilirubin, UA: NEGATIVE
Glucose, UA: NEGATIVE mg/dL
Ketones, POC UA: NEGATIVE mg/dL
Nitrite, UA: NEGATIVE
Spec Grav, UA: 1.015 (ref 1.010–1.025)
Urobilinogen, UA: 0.2 U/dL
pH, UA: 6.5 (ref 5.0–8.0)

## 2024-01-08 NOTE — Patient Instructions (Signed)
Align  Activia yogurt

## 2024-01-10 ENCOUNTER — Encounter: Payer: Self-pay | Admitting: Family Medicine

## 2024-01-10 ENCOUNTER — Encounter: Payer: Self-pay | Admitting: Nurse Practitioner

## 2024-01-10 LAB — SPECIMEN STATUS REPORT

## 2024-01-10 LAB — URINE CULTURE

## 2024-01-10 NOTE — Progress Notes (Signed)
   Subjective:    Patient ID: Sharon Bray, female    DOB: 12/14/1965, 58 y.o.   MRN: [id number]  HPI Presents for complaints of vomiting and diarrhea that began 2 days ago.  States she was not feeling well for a couple of days before this.  Slight congestion.  Minimal cough.  Possible slight fever.  States her grandson was diagnosed with the flu recently.  Had no appetite earlier in the week but has been able to get her fluids down and eating bland foods such as potatoes.  Having slight burning with urination for the past couple of days with slight pressure with urination.  No urgency or frequency.  No history of recent UTI.  Same sexual partner.   Review of Systems  Constitutional:  Positive for fatigue. Negative for fever.  HENT:  Positive for congestion. Negative for ear pain and sore throat.   Respiratory:  Positive for cough. Negative for chest tightness, shortness of breath and wheezing.   Cardiovascular:  Negative for chest pain.  Gastrointestinal:  Positive for abdominal pain, diarrhea, nausea and vomiting.       Objective:   Physical Exam NAD.  Alert, oriented.  TMs clear effusion, no erythema.  Pharynx clear and moist.  Neck supple with mild soft anterior cervical adenopathy.  Lungs clear.  Heart regular rate rhythm.  Abdomen soft nondistended with moderate epigastric area tenderness on exam.  No rebound or guarding. Today's Vitals   01/08/24 1116  BP: 110/72  Pulse: 72  Temp: 97.7 F (36.5 C)  SpO2: 99%  Weight: 204 lb 9.6 oz (92.8 kg)  Height: 5\' 5"  (1.651 m)   Body mass index is 34.05 kg/m.  POCT URINALYSIS DIP (CLINITEK)   Collection Time: 01/08/24 12:42 PM  Result Value Ref Range   Color, UA yellow yellow   Clarity, UA     Glucose, UA negative negative mg/dL   Bilirubin, UA negative negative   Ketones, POC UA negative negative mg/dL   Spec Grav, UA 4.098 1.191 - 1.025   Blood, UA trace-intact (A) negative   pH, UA 6.5 5.0 - 8.0   POC PROTEIN,UA trace  negative, trace   Urobilinogen, UA 0.2 0.2 or 1.0 E.U./dL   Nitrite, UA Negative Negative   Leukocytes, UA Trace (A) Negative        Assessment & Plan:  Viral gastroenteritis  Dysuria - Plan: POCT URINALYSIS DIP (CLINITEK), Urine Culture Continue increasing fluid intake.  Recommend bland diet.  Encourage patient to start bowel probiotic. Warning signs reviewed.  Call back in 72 hours if no improvement, go to ED or urgent care over the weekend if worse. Urine culture pending.

## 2024-07-14 ENCOUNTER — Other Ambulatory Visit: Payer: Self-pay

## 2024-07-14 ENCOUNTER — Telehealth: Payer: Self-pay

## 2024-07-14 DIAGNOSIS — I10 Essential (primary) hypertension: Secondary | ICD-10-CM

## 2024-07-14 MED ORDER — HYDROCHLOROTHIAZIDE 25 MG PO TABS: 25.0000 mg | ORAL_TABLET | Freq: Every day | ORAL | 1 refills | Status: AC

## 2024-07-14 MED ORDER — LISINOPRIL 10 MG PO TABS: 10.0000 mg | ORAL_TABLET | Freq: Every day | ORAL | 1 refills | Status: AC

## 2024-07-14 NOTE — Telephone Encounter (Signed)
 Prescription Request  07/14/2024  LOV: 01/08/2024  What is the name of the medication or equipment? hydrochlorothiazide  (HYDRODIURIL ) 25 MG tablet   Have you contacted your pharmacy to request a refill? Yes   Which pharmacy would you like this sent to?  [facility] - [place], [place] - [address] [place] [address] Phone: [phone] Fax: [phone]    Patient notified that their request is being sent to the clinical staff for review and that they should receive a response within 2 business days.   Please advise at [facility] [phone]

## 2024-07-14 NOTE — Telephone Encounter (Signed)
 Prescription Request  07/14/2024  LOV: Visit date not found  What is the name of the medication or equipment? lisinopril  (ZESTRIL ) 10 MG tablet   Have you contacted your pharmacy to request a refill? Yes   Which pharmacy would you like this sent to?  [facility] - [place], [place] - [address] [place] [address] Phone: [phone] Fax: [phone]    Patient notified that their request is being sent to the clinical staff for review and that they should receive a response within 2 business days.   Please advise at Mobile [phone] (mobile)

## 2024-09-09 LAB — HM COLONOSCOPY

## 2024-11-14 ENCOUNTER — Encounter: Payer: Self-pay | Admitting: Nurse Practitioner

## 2024-11-14 ENCOUNTER — Ambulatory Visit: Admitting: Nurse Practitioner

## 2024-11-14 VITALS — BP 108/72 | HR 70 | Temp 99.1°F | Resp 98 | Ht 65.0 in | Wt 182.2 lb

## 2024-11-14 DIAGNOSIS — E781 Pure hyperglyceridemia: Secondary | ICD-10-CM

## 2024-11-14 DIAGNOSIS — R7303 Prediabetes: Secondary | ICD-10-CM

## 2024-11-14 DIAGNOSIS — I10 Essential (primary) hypertension: Secondary | ICD-10-CM

## 2024-11-14 DIAGNOSIS — Z23 Encounter for immunization: Secondary | ICD-10-CM

## 2024-11-14 DIAGNOSIS — E876 Hypokalemia: Secondary | ICD-10-CM

## 2024-11-14 DIAGNOSIS — Z79899 Other long term (current) drug therapy: Secondary | ICD-10-CM

## 2024-11-14 NOTE — Progress Notes (Signed)
 Subjective:    Patient ID: Sharon Bray, female    DOB: [birth year], 58 y.o.   MRN: [id number]  HPI Discussed the use of AI scribe software for clinical note transcription with the patient, who gave verbal consent to proceed.  History of Present Illness Sharon Bray is a 58 year old female with hypertension who presents for a six month follow-up.  Her blood pressure is well-controlled with readings around 108/72 mmHg. She occasionally checks it at work when experiencing headaches. No dizziness or fainting spells. She is currently on lisinopril  and hydrochlorothiazide . No swelling in her legs.  She no longer takes pantoprazole  for reflux as her symptoms have improved. She has a history of constipation and is currently taking Linzess. She has a history of constipation and is currently taking Linzess, which she finds helpful. Her last colonoscopy revealed two polyps, and she was advised to return in ten years.  She has experienced significant weight loss, dropping from 212 pounds in January to 182 pounds currently, aided by weight loss injections at private clinic in [place], [place]. She has made lifestyle changes, including reducing portion sizes, limiting sugar intake, and walking daily on a trail at work.  She received a pneumococcal vaccine in 2022. No respiratory symptoms such as chest pain, shortness of breath, coughing, or wheezing. No fevers, sore throats, or difficulty swallowing.  Gets regular mammograms and physicals with GYN.    Review of Systems  Constitutional:  Negative for fever.  HENT:  Negative for sore throat and trouble swallowing.   Respiratory:  Negative for cough, chest tightness, shortness of breath and wheezing.   Cardiovascular:  Negative for chest pain and leg swelling.  Gastrointestinal:  Positive for constipation. Negative for abdominal pain, blood in stool, diarrhea, nausea and vomiting.  Neurological:  Negative for syncope and light-headedness.      11/14/2024     9:01 AM  Depression screen PHQ 2/9  Decreased Interest 0  Down, Depressed, Hopeless 0  PHQ - 2 Score 0  Altered sleeping 0  Tired, decreased energy 0  Change in appetite 0  Feeling bad or failure about yourself  0  Trouble concentrating 0  Moving slowly or fidgety/restless 0  Suicidal thoughts 0  PHQ-9 Score 0  Difficult doing work/chores Not difficult at all   Social History   Tobacco Use   Smoking status: Never   Smokeless tobacco: Never  Vaping Use   Vaping status: Never Used  Substance Use Topics   Alcohol use: No    Alcohol/week: 0.0 standard drinks of alcohol   Drug use: No        Objective:   Physical Exam Vitals and nursing note reviewed.  Constitutional:      General: She is not in acute distress. Neck:     Comments: Thyroid nontender to palpation, no mass or goiter noted. Cardiovascular:     Rate and Rhythm: Normal rate and regular rhythm.     Heart sounds: Normal heart sounds.  Pulmonary:     Effort: Pulmonary effort is normal.     Breath sounds: Normal breath sounds.  Abdominal:     General: There is no distension.     Palpations: Abdomen is soft.     Tenderness: There is no abdominal tenderness. There is no guarding or rebound.  Musculoskeletal:     Cervical back: Neck supple.     Right lower leg: No edema.  Skin:    General: Skin is warm and dry.  Neurological:  Mental Status: She is alert and oriented to person, place, and time.  Psychiatric:        Mood and Affect: Mood normal.        Behavior: Behavior normal.        Thought Content: Thought content normal.    Today's Vitals   11/14/24 0859  BP: 108/72  Pulse: 70  Resp: (!) 98  Temp: 99.1 F (37.3 C)  Weight: 182 lb 4 oz (82.7 kg)  Height: 5' 5 (1.651 m)   Body mass index is 30.33 kg/m.         Assessment & Plan:  1. Immunization due  - Flu vaccine trivalent PF, 6mos and older(Flulaval,Afluria,Fluarix,Fluzone)  2. Essential hypertension, benign  (Primary) Blood pressure controlled at 108/72 mmHg with lisinopril  and hydrochlorothiazide . Potential need for dosage adjustment with continued weight loss. - Monitor blood pressure regularly, especially when standing. - Consider reducing lisinopril  or hydrochlorothiazide  if dizziness or hypotension occurs. - Comprehensive metabolic panel with GFR - Lipid panel  3. Hypertriglyceridemia  - Lipid panel  4. Hypokalemia  - Comprehensive metabolic panel with GFR  5. Prediabetes  - Comprehensive metabolic panel with GFR - Hemoglobin A1c  6. High risk medication use  - CBC with Differential/Platelet   Return in about 6 months (around 05/15/2025).

## 2024-11-15 ENCOUNTER — Ambulatory Visit: Payer: Self-pay | Admitting: Nurse Practitioner

## 2024-11-15 LAB — CBC WITH DIFFERENTIAL/PLATELET
Basophils Absolute: 0 x10E3/uL (ref 0.0–0.2)
Basos: 1 %
EOS (ABSOLUTE): 0 x10E3/uL (ref 0.0–0.4)
Eos: 1 %
Hematocrit: 40.6 % (ref 34.0–46.6)
Hemoglobin: 13.1 g/dL (ref 11.1–15.9)
Immature Grans (Abs): 0 x10E3/uL (ref 0.0–0.1)
Immature Granulocytes: 0 %
Lymphocytes Absolute: 1.1 x10E3/uL (ref 0.7–3.1)
Lymphs: 34 %
MCH: 28.9 pg (ref 26.6–33.0)
MCHC: 32.3 g/dL (ref 31.5–35.7)
MCV: 90 fL (ref 79–97)
Monocytes Absolute: 0.2 x10E3/uL (ref 0.1–0.9)
Monocytes: 5 %
Neutrophils Absolute: 2 x10E3/uL (ref 1.4–7.0)
Neutrophils: 59 %
Platelets: 228 x10E3/uL (ref 150–450)
RBC: 4.53 x10E6/uL (ref 3.77–5.28)
RDW: 13.1 % (ref 11.7–15.4)
WBC: 3.3 x10E3/uL — ABNORMAL LOW (ref 3.4–10.8)

## 2024-11-15 LAB — COMPREHENSIVE METABOLIC PANEL WITH GFR
ALT: 10 IU/L (ref 0–32)
AST: 18 IU/L (ref 0–40)
Albumin: 4.5 g/dL (ref 3.8–4.9)
Alkaline Phosphatase: 62 IU/L (ref 49–135)
BUN/Creatinine Ratio: 14 (ref 12–28)
BUN: 15 mg/dL (ref 8–27)
Bilirubin Total: 0.8 mg/dL (ref 0.0–1.2)
CO2: 27 mmol/L (ref 20–29)
Calcium: 9.5 mg/dL (ref 8.7–10.3)
Chloride: 100 mmol/L (ref 96–106)
Creatinine, Ser: 1.05 mg/dL — ABNORMAL HIGH (ref 0.57–1.00)
Globulin, Total: 2.7 g/dL (ref 1.5–4.5)
Glucose: 83 mg/dL (ref 70–99)
Potassium: 3.4 mmol/L — ABNORMAL LOW (ref 3.5–5.2)
Sodium: 139 mmol/L (ref 134–144)
Total Protein: 7.2 g/dL (ref 6.0–8.5)
eGFR: 61 mL/min/1.73 (ref >59)

## 2024-11-15 LAB — LIPID PANEL
Chol/HDL Ratio: 4.1 ratio (ref 0.0–4.4)
Cholesterol, Total: 164 mg/dL (ref 100–199)
HDL: 40 mg/dL (ref >39)
LDL Chol Calc (NIH): 109 mg/dL — ABNORMAL HIGH (ref 0–99)
Triglycerides: 80 mg/dL (ref 0–149)
VLDL Cholesterol Cal: 15 mg/dL (ref 5–40)

## 2024-11-15 LAB — HEMOGLOBIN A1C
Est. average glucose Bld gHb Est-mCnc: 108 mg/dL
Hgb A1c MFr Bld: 5.4 % (ref 4.8–5.6)

## 2024-11-17 ENCOUNTER — Other Ambulatory Visit: Payer: Self-pay | Admitting: Nurse Practitioner

## 2024-11-17 DIAGNOSIS — E876 Hypokalemia: Secondary | ICD-10-CM

## 2025-05-15 ENCOUNTER — Ambulatory Visit: Admitting: Nurse Practitioner
